# Patient Record
Sex: Female | Born: 1960
Health system: Southern US, Community
[De-identification: ages and names within clinical notes are randomized; demographics above are authoritative.]

## PROBLEM LIST (undated history)

## (undated) DIAGNOSIS — Z923 Personal history of irradiation: Secondary | ICD-10-CM

## (undated) DIAGNOSIS — K649 Unspecified hemorrhoids: Secondary | ICD-10-CM

## (undated) DIAGNOSIS — C801 Malignant (primary) neoplasm, unspecified: Secondary | ICD-10-CM

## (undated) DIAGNOSIS — Z9221 Personal history of antineoplastic chemotherapy: Secondary | ICD-10-CM

## (undated) DIAGNOSIS — C50911 Malignant neoplasm of unspecified site of right female breast: Secondary | ICD-10-CM

## (undated) HISTORY — DX: Malignant neoplasm of unspecified site of right female breast: C50.911

## (undated) HISTORY — DX: Malignant (primary) neoplasm, unspecified: C80.1

## (undated) HISTORY — DX: Unspecified hemorrhoids: K64.9

---

## 1998-01-03 ENCOUNTER — Other Ambulatory Visit: Admission: RE | Admit: 1998-01-03 | Discharge: 1998-01-03 | Payer: Self-pay | Admitting: Obstetrics and Gynecology

## 1998-05-19 ENCOUNTER — Ambulatory Visit (HOSPITAL_COMMUNITY): Admission: RE | Admit: 1998-05-19 | Discharge: 1998-05-19 | Payer: Self-pay | Admitting: Obstetrics and Gynecology

## 1999-01-12 ENCOUNTER — Other Ambulatory Visit: Admission: RE | Admit: 1999-01-12 | Discharge: 1999-01-12 | Payer: Self-pay | Admitting: Obstetrics and Gynecology

## 2003-02-22 ENCOUNTER — Encounter: Admission: RE | Admit: 2003-02-22 | Discharge: 2003-02-22 | Payer: Self-pay | Admitting: Family Medicine

## 2003-02-22 ENCOUNTER — Encounter: Payer: Self-pay | Admitting: Family Medicine

## 2004-01-12 ENCOUNTER — Ambulatory Visit (HOSPITAL_COMMUNITY): Admission: RE | Admit: 2004-01-12 | Discharge: 2004-01-12 | Payer: Self-pay | Admitting: Obstetrics & Gynecology

## 2008-08-05 HISTORY — PX: COLONOSCOPY: SHX174

## 2008-11-07 ENCOUNTER — Other Ambulatory Visit: Admission: RE | Admit: 2008-11-07 | Discharge: 2008-11-07 | Payer: Self-pay | Admitting: Obstetrics & Gynecology

## 2009-03-05 ENCOUNTER — Observation Stay (HOSPITAL_COMMUNITY): Admission: EM | Admit: 2009-03-05 | Discharge: 2009-03-07 | Payer: Self-pay | Admitting: Emergency Medicine

## 2009-03-07 ENCOUNTER — Ambulatory Visit: Payer: Self-pay | Admitting: Internal Medicine

## 2009-08-05 DIAGNOSIS — Z923 Personal history of irradiation: Secondary | ICD-10-CM

## 2009-08-05 DIAGNOSIS — Z9221 Personal history of antineoplastic chemotherapy: Secondary | ICD-10-CM

## 2009-08-05 HISTORY — DX: Personal history of antineoplastic chemotherapy: Z92.21

## 2009-08-05 HISTORY — DX: Personal history of irradiation: Z92.3

## 2009-08-05 HISTORY — PX: MASTECTOMY: SHX3

## 2009-11-16 ENCOUNTER — Encounter: Admission: RE | Admit: 2009-11-16 | Discharge: 2009-11-16 | Payer: Self-pay | Admitting: Obstetrics and Gynecology

## 2009-11-16 DIAGNOSIS — C50911 Malignant neoplasm of unspecified site of right female breast: Secondary | ICD-10-CM

## 2009-11-16 HISTORY — DX: Malignant neoplasm of unspecified site of right female breast: C50.911

## 2009-11-21 ENCOUNTER — Ambulatory Visit: Payer: Self-pay | Admitting: Oncology

## 2009-11-24 ENCOUNTER — Encounter: Admission: RE | Admit: 2009-11-24 | Discharge: 2009-11-24 | Payer: Self-pay | Admitting: Internal Medicine

## 2009-12-18 ENCOUNTER — Other Ambulatory Visit: Admission: RE | Admit: 2009-12-18 | Discharge: 2009-12-18 | Payer: Self-pay | Admitting: Obstetrics & Gynecology

## 2009-12-21 ENCOUNTER — Ambulatory Visit (HOSPITAL_COMMUNITY): Admission: RE | Admit: 2009-12-21 | Discharge: 2009-12-22 | Payer: Self-pay | Admitting: Surgery

## 2009-12-21 ENCOUNTER — Encounter (INDEPENDENT_AMBULATORY_CARE_PROVIDER_SITE_OTHER): Payer: Self-pay | Admitting: Surgery

## 2010-01-04 ENCOUNTER — Ambulatory Visit: Payer: Self-pay | Admitting: Oncology

## 2010-01-05 LAB — CBC WITH DIFFERENTIAL/PLATELET
BASO%: 0.3 % (ref 0.0–2.0)
HCT: 38.5 % (ref 34.8–46.6)
HGB: 13.2 g/dL (ref 11.6–15.9)
MCV: 100 fL (ref 79.5–101.0)
MONO#: 0.5 10*3/uL (ref 0.1–0.9)
NEUT%: 71.1 % (ref 38.4–76.8)
Platelets: 297 10*3/uL (ref 145–400)
RBC: 3.85 10*6/uL (ref 3.70–5.45)
RDW: 14.6 % — ABNORMAL HIGH (ref 11.2–14.5)
WBC: 7 10*3/uL (ref 3.9–10.3)

## 2010-01-05 LAB — COMPREHENSIVE METABOLIC PANEL
AST: 15 U/L (ref 0–37)
Alkaline Phosphatase: 45 U/L (ref 39–117)
CO2: 26 mEq/L (ref 19–32)
Chloride: 102 mEq/L (ref 96–112)
Creatinine, Ser: 0.58 mg/dL (ref 0.40–1.20)
Potassium: 4.2 mEq/L (ref 3.5–5.3)
Total Bilirubin: 0.2 mg/dL — ABNORMAL LOW (ref 0.3–1.2)

## 2010-01-11 ENCOUNTER — Ambulatory Visit: Admission: RE | Admit: 2010-01-11 | Discharge: 2010-03-12 | Payer: Self-pay | Admitting: Radiation Oncology

## 2010-01-17 ENCOUNTER — Ambulatory Visit: Payer: Self-pay | Admitting: Cardiology

## 2010-01-17 ENCOUNTER — Encounter: Payer: Self-pay | Admitting: Oncology

## 2010-01-17 ENCOUNTER — Ambulatory Visit: Admission: RE | Admit: 2010-01-17 | Discharge: 2010-01-17 | Payer: Self-pay | Admitting: Oncology

## 2010-02-07 ENCOUNTER — Ambulatory Visit: Payer: Self-pay | Admitting: Oncology

## 2010-02-09 LAB — COMPREHENSIVE METABOLIC PANEL
AST: 17 U/L (ref 0–37)
BUN: 8 mg/dL (ref 6–23)
Calcium: 9.4 mg/dL (ref 8.4–10.5)
Chloride: 105 mEq/L (ref 96–112)
Creatinine, Ser: 0.5 mg/dL (ref 0.40–1.20)
Sodium: 139 mEq/L (ref 135–145)

## 2010-02-09 LAB — CBC WITH DIFFERENTIAL/PLATELET
Basophils Absolute: 0 10*3/uL (ref 0.0–0.1)
EOS%: 0.9 % (ref 0.0–7.0)
HCT: 40.8 % (ref 34.8–46.6)
HGB: 13.8 g/dL (ref 11.6–15.9)
MCH: 33.2 pg (ref 25.1–34.0)
MCHC: 33.8 g/dL (ref 31.5–36.0)
MCV: 98.1 fL (ref 79.5–101.0)
MONO#: 0.6 10*3/uL (ref 0.1–0.9)
NEUT%: 65 % (ref 38.4–76.8)
Platelets: 194 10*3/uL (ref 145–400)
RDW: 12.8 % (ref 11.2–14.5)

## 2010-02-23 LAB — COMPREHENSIVE METABOLIC PANEL
Albumin: 4 g/dL (ref 3.5–5.2)
BUN: 9 mg/dL (ref 6–23)
Creatinine, Ser: 0.5 mg/dL (ref 0.40–1.20)
Glucose, Bld: 106 mg/dL — ABNORMAL HIGH (ref 70–99)
Sodium: 139 mEq/L (ref 135–145)
Total Bilirubin: 0.1 mg/dL — ABNORMAL LOW (ref 0.3–1.2)
Total Protein: 6.3 g/dL (ref 6.0–8.3)

## 2010-02-23 LAB — CBC WITH DIFFERENTIAL/PLATELET
BASO%: 1.1 % (ref 0.0–2.0)
Basophils Absolute: 0.1 10*3/uL (ref 0.0–0.1)
HCT: 39.3 % (ref 34.8–46.6)
HGB: 13.3 g/dL (ref 11.6–15.9)
MCH: 32.8 pg (ref 25.1–34.0)
MCV: 97 fL (ref 79.5–101.0)
Platelets: 192 10*3/uL (ref 145–400)
RDW: 12.6 % (ref 11.2–14.5)
WBC: 12.9 10*3/uL — ABNORMAL HIGH (ref 3.9–10.3)
nRBC: 0 % (ref 0–0)

## 2010-03-09 ENCOUNTER — Ambulatory Visit: Payer: Self-pay | Admitting: Oncology

## 2010-03-09 LAB — COMPREHENSIVE METABOLIC PANEL
AST: 20 U/L (ref 0–37)
Albumin: 4.3 g/dL (ref 3.5–5.2)
Alkaline Phosphatase: 77 U/L (ref 39–117)
CO2: 22 mEq/L (ref 19–32)
Calcium: 8.9 mg/dL (ref 8.4–10.5)
Potassium: 3.9 mEq/L (ref 3.5–5.3)
Total Protein: 6.4 g/dL (ref 6.0–8.3)

## 2010-03-09 LAB — CBC WITH DIFFERENTIAL/PLATELET
BASO%: 0.6 % (ref 0.0–2.0)
Eosinophils Absolute: 0 10*3/uL (ref 0.0–0.5)
MCHC: 33.5 g/dL (ref 31.5–36.0)
MONO#: 0.4 10*3/uL (ref 0.1–0.9)
MONO%: 9.9 % (ref 0.0–14.0)
lymph#: 1 10*3/uL (ref 0.9–3.3)

## 2010-03-23 LAB — CBC WITH DIFFERENTIAL/PLATELET
BASO%: 0.2 % (ref 0.0–2.0)
Basophils Absolute: 0 10*3/uL (ref 0.0–0.1)
MCH: 32.2 pg (ref 25.1–34.0)
MONO%: 12.8 % (ref 0.0–14.0)
NEUT#: 6.6 10*3/uL — ABNORMAL HIGH (ref 1.5–6.5)
Platelets: 194 10*3/uL (ref 145–400)

## 2010-03-23 LAB — COMPREHENSIVE METABOLIC PANEL
ALT: 11 U/L (ref 0–35)
Albumin: 3.9 g/dL (ref 3.5–5.2)
Alkaline Phosphatase: 66 U/L (ref 39–117)
BUN: 7 mg/dL (ref 6–23)
CO2: 26 mEq/L (ref 19–32)
Calcium: 8.2 mg/dL — ABNORMAL LOW (ref 8.4–10.5)
Glucose, Bld: 85 mg/dL (ref 70–99)
Potassium: 3.9 mEq/L (ref 3.5–5.3)
Sodium: 143 mEq/L (ref 135–145)
Total Bilirubin: 0.2 mg/dL — ABNORMAL LOW (ref 0.3–1.2)

## 2010-04-06 LAB — CBC WITH DIFFERENTIAL/PLATELET
BASO%: 0.3 % (ref 0.0–2.0)
Basophils Absolute: 0 10*3/uL (ref 0.0–0.1)
HGB: 11.1 g/dL — ABNORMAL LOW (ref 11.6–15.9)
MCHC: 33.3 g/dL (ref 31.5–36.0)
NEUT#: 9.5 10*3/uL — ABNORMAL HIGH (ref 1.5–6.5)
NEUT%: 81.8 % — ABNORMAL HIGH (ref 38.4–76.8)
Platelets: 182 10*3/uL (ref 145–400)
RBC: 3.44 10*6/uL — ABNORMAL LOW (ref 3.70–5.45)
WBC: 11.6 10*3/uL — ABNORMAL HIGH (ref 3.9–10.3)

## 2010-04-06 LAB — COMPREHENSIVE METABOLIC PANEL
ALT: 11 U/L (ref 0–35)
AST: 17 U/L (ref 0–37)
CO2: 29 mEq/L (ref 19–32)
Creatinine, Ser: 0.49 mg/dL (ref 0.40–1.20)
Sodium: 143 mEq/L (ref 135–145)
Total Protein: 5.9 g/dL — ABNORMAL LOW (ref 6.0–8.3)

## 2010-04-17 ENCOUNTER — Ambulatory Visit: Payer: Self-pay | Admitting: Oncology

## 2010-04-20 LAB — CBC WITH DIFFERENTIAL/PLATELET
Basophils Absolute: 0 10*3/uL (ref 0.0–0.1)
HGB: 11.3 g/dL — ABNORMAL LOW (ref 11.6–15.9)
MCH: 32.7 pg (ref 25.1–34.0)
MCHC: 33.1 g/dL (ref 31.5–36.0)
MCV: 98.6 fL (ref 79.5–101.0)
MONO#: 0.7 10*3/uL (ref 0.1–0.9)
MONO%: 4.7 % (ref 0.0–14.0)
NEUT#: 13.8 10*3/uL — ABNORMAL HIGH (ref 1.5–6.5)
NEUT%: 87.9 % — ABNORMAL HIGH (ref 38.4–76.8)
Platelets: 174 10*3/uL (ref 145–400)
RBC: 3.46 10*6/uL — ABNORMAL LOW (ref 3.70–5.45)
RDW: 19.9 % — ABNORMAL HIGH (ref 11.2–14.5)
WBC: 15.7 10*3/uL — ABNORMAL HIGH (ref 3.9–10.3)

## 2010-04-20 LAB — COMPREHENSIVE METABOLIC PANEL
ALT: 19 U/L (ref 0–35)
AST: 25 U/L (ref 0–37)
Albumin: 3.9 g/dL (ref 3.5–5.2)
Alkaline Phosphatase: 104 U/L (ref 39–117)
BUN: 8 mg/dL (ref 6–23)
CO2: 27 mEq/L (ref 19–32)
Calcium: 8.7 mg/dL (ref 8.4–10.5)
Chloride: 104 mEq/L (ref 96–112)
Creatinine, Ser: 0.49 mg/dL (ref 0.40–1.20)
Glucose, Bld: 89 mg/dL (ref 70–99)
Potassium: 3.6 mEq/L (ref 3.5–5.3)
Sodium: 145 mEq/L (ref 135–145)
Total Bilirubin: 0.2 mg/dL — ABNORMAL LOW (ref 0.3–1.2)
Total Protein: 5.9 g/dL — ABNORMAL LOW (ref 6.0–8.3)

## 2010-04-20 LAB — MAGNESIUM: Magnesium: 1.6 mg/dL (ref 1.5–2.5)

## 2010-05-04 LAB — CBC WITH DIFFERENTIAL/PLATELET
BASO%: 0.2 % (ref 0.0–2.0)
EOS%: 0 % (ref 0.0–7.0)
LYMPH%: 9.1 % — ABNORMAL LOW (ref 14.0–49.7)
MCH: 33.4 pg (ref 25.1–34.0)
MCHC: 32.8 g/dL (ref 31.5–36.0)
MONO#: 0.9 10*3/uL (ref 0.1–0.9)
MONO%: 7.3 % (ref 0.0–14.0)
NEUT#: 9.9 10*3/uL — ABNORMAL HIGH (ref 1.5–6.5)
NEUT%: 83.4 % — ABNORMAL HIGH (ref 38.4–76.8)
Platelets: 195 10*3/uL (ref 145–400)
RDW: 21.9 % — ABNORMAL HIGH (ref 11.2–14.5)

## 2010-05-07 LAB — COMPREHENSIVE METABOLIC PANEL
ALT: 15 U/L (ref 0–35)
AST: 16 U/L (ref 0–37)
Albumin: 3.6 g/dL (ref 3.5–5.2)
Alkaline Phosphatase: 77 U/L (ref 39–117)
CO2: 27 mEq/L (ref 19–32)
Calcium: 8.6 mg/dL (ref 8.4–10.5)
Chloride: 103 mEq/L (ref 96–112)
Glucose, Bld: 104 mg/dL — ABNORMAL HIGH (ref 70–99)
Total Protein: 5.5 g/dL — ABNORMAL LOW (ref 6.0–8.3)

## 2010-05-17 ENCOUNTER — Ambulatory Visit: Payer: Self-pay | Admitting: Oncology

## 2010-05-17 LAB — COMPREHENSIVE METABOLIC PANEL
ALT: 15 U/L (ref 0–35)
AST: 20 U/L (ref 0–37)
BUN: 9 mg/dL (ref 6–23)
Calcium: 8.2 mg/dL — ABNORMAL LOW (ref 8.4–10.5)
Chloride: 104 mEq/L (ref 96–112)
Creatinine, Ser: 0.49 mg/dL (ref 0.40–1.20)
Glucose, Bld: 111 mg/dL — ABNORMAL HIGH (ref 70–99)
Potassium: 3.9 mEq/L (ref 3.5–5.3)
Total Bilirubin: 0.3 mg/dL (ref 0.3–1.2)

## 2010-05-17 LAB — CBC WITH DIFFERENTIAL/PLATELET
EOS%: 0 % (ref 0.0–7.0)
HCT: 33.5 % — ABNORMAL LOW (ref 34.8–46.6)
HGB: 10.9 g/dL — ABNORMAL LOW (ref 11.6–15.9)
MCH: 34.2 pg — ABNORMAL HIGH (ref 25.1–34.0)
MCHC: 32.5 g/dL (ref 31.5–36.0)
MCV: 105 fL — ABNORMAL HIGH (ref 79.5–101.0)
MONO#: 1.2 10*3/uL — ABNORMAL HIGH (ref 0.1–0.9)
MONO%: 8.3 % (ref 0.0–14.0)
NEUT#: 11.9 10*3/uL — ABNORMAL HIGH (ref 1.5–6.5)
NEUT%: 83.8 % — ABNORMAL HIGH (ref 38.4–76.8)
Platelets: 164 10*3/uL (ref 145–400)
RBC: 3.19 10*6/uL — ABNORMAL LOW (ref 3.70–5.45)
WBC: 14.2 10*3/uL — ABNORMAL HIGH (ref 3.9–10.3)

## 2010-05-18 ENCOUNTER — Ambulatory Visit
Admission: RE | Admit: 2010-05-18 | Discharge: 2010-07-20 | Payer: Self-pay | Source: Home / Self Care | Attending: Radiation Oncology | Admitting: Radiation Oncology

## 2010-06-08 LAB — COMPREHENSIVE METABOLIC PANEL
AST: 14 U/L (ref 0–37)
Albumin: 3.8 g/dL (ref 3.5–5.2)
BUN: 9 mg/dL (ref 6–23)
Calcium: 8.8 mg/dL (ref 8.4–10.5)
Creatinine, Ser: 0.43 mg/dL (ref 0.40–1.20)
Potassium: 4.4 mEq/L (ref 3.5–5.3)
Total Bilirubin: 0.4 mg/dL (ref 0.3–1.2)

## 2010-06-08 LAB — CBC WITH DIFFERENTIAL/PLATELET
Eosinophils Absolute: 0 10*3/uL (ref 0.0–0.5)
HCT: 33 % — ABNORMAL LOW (ref 34.8–46.6)
MCH: 37.8 pg — ABNORMAL HIGH (ref 25.1–34.0)
MCHC: 33.4 g/dL (ref 31.5–36.0)
MCV: 113.2 fL — ABNORMAL HIGH (ref 79.5–101.0)
MONO#: 0.6 10*3/uL (ref 0.1–0.9)
MONO%: 9.9 % (ref 0.0–14.0)
NEUT#: 5.1 10*3/uL (ref 1.5–6.5)
RBC: 2.92 10*6/uL — ABNORMAL LOW (ref 3.70–5.45)
WBC: 6.1 10*3/uL (ref 3.9–10.3)

## 2010-06-13 ENCOUNTER — Encounter: Payer: Self-pay | Admitting: Radiation Oncology

## 2010-06-13 ENCOUNTER — Ambulatory Visit: Admission: RE | Admit: 2010-06-13 | Discharge: 2010-06-13 | Payer: Self-pay | Admitting: Radiation Oncology

## 2010-06-13 ENCOUNTER — Ambulatory Visit: Payer: Self-pay | Admitting: Cardiology

## 2010-06-20 ENCOUNTER — Ambulatory Visit: Payer: Self-pay | Admitting: Oncology

## 2010-06-22 LAB — COMPREHENSIVE METABOLIC PANEL
AST: 21 U/L (ref 0–37)
Alkaline Phosphatase: 59 U/L (ref 39–117)
BUN: 9 mg/dL (ref 6–23)
Calcium: 9.2 mg/dL (ref 8.4–10.5)
Chloride: 99 mEq/L (ref 96–112)
Glucose, Bld: 102 mg/dL — ABNORMAL HIGH (ref 70–99)
Potassium: 3.9 mEq/L (ref 3.5–5.3)
Total Bilirubin: 0.3 mg/dL (ref 0.3–1.2)
Total Protein: 6.4 g/dL (ref 6.0–8.3)

## 2010-06-22 LAB — CBC WITH DIFFERENTIAL/PLATELET
BASO%: 0.2 % (ref 0.0–2.0)
Basophils Absolute: 0 10*3/uL (ref 0.0–0.1)
HCT: 43.4 % (ref 34.8–46.6)
MCH: 36.1 pg — ABNORMAL HIGH (ref 25.1–34.0)
MCHC: 32.9 g/dL (ref 31.5–36.0)
MONO#: 0.5 10*3/uL (ref 0.1–0.9)
NEUT%: 78.7 % — ABNORMAL HIGH (ref 38.4–76.8)
Platelets: 154 10*3/uL (ref 145–400)
RBC: 3.96 10*6/uL (ref 3.70–5.45)
RDW: 14.5 % (ref 11.2–14.5)
WBC: 4.1 10*3/uL (ref 3.9–10.3)
lymph#: 0.4 10*3/uL — ABNORMAL LOW (ref 0.9–3.3)

## 2010-06-22 LAB — MAGNESIUM: Magnesium: 1.9 mg/dL (ref 1.5–2.5)

## 2010-07-27 ENCOUNTER — Ambulatory Visit: Payer: Self-pay | Admitting: Oncology

## 2010-08-01 LAB — BASIC METABOLIC PANEL
BUN: 11 mg/dL (ref 6–23)
Chloride: 103 mEq/L (ref 96–112)
Creatinine, Ser: 0.52 mg/dL (ref 0.40–1.20)
Glucose, Bld: 88 mg/dL (ref 70–99)
Sodium: 142 mEq/L (ref 135–145)

## 2010-08-05 HISTORY — PX: ENDOMETRIAL ABLATION: SHX621

## 2010-08-26 ENCOUNTER — Encounter: Payer: Self-pay | Admitting: Obstetrics & Gynecology

## 2010-09-06 ENCOUNTER — Ambulatory Visit: Payer: PRIVATE HEALTH INSURANCE | Attending: Radiation Oncology | Admitting: Radiation Oncology

## 2010-09-06 ENCOUNTER — Ambulatory Visit: Payer: Self-pay | Admitting: Radiation Oncology

## 2010-09-21 ENCOUNTER — Other Ambulatory Visit: Payer: Self-pay | Admitting: Oncology

## 2010-09-21 ENCOUNTER — Encounter (HOSPITAL_BASED_OUTPATIENT_CLINIC_OR_DEPARTMENT_OTHER): Payer: PRIVATE HEALTH INSURANCE | Admitting: Oncology

## 2010-09-21 DIAGNOSIS — C50419 Malignant neoplasm of upper-outer quadrant of unspecified female breast: Secondary | ICD-10-CM

## 2010-09-21 DIAGNOSIS — C50919 Malignant neoplasm of unspecified site of unspecified female breast: Secondary | ICD-10-CM

## 2010-09-21 DIAGNOSIS — Z5189 Encounter for other specified aftercare: Secondary | ICD-10-CM

## 2010-09-21 DIAGNOSIS — Z5111 Encounter for antineoplastic chemotherapy: Secondary | ICD-10-CM

## 2010-09-21 LAB — CBC WITH DIFFERENTIAL/PLATELET
BASO%: 0.3 % (ref 0.0–2.0)
EOS%: 1.1 % (ref 0.0–7.0)
Eosinophils Absolute: 0 10*3/uL (ref 0.0–0.5)
HGB: 14.4 g/dL (ref 11.6–15.9)
LYMPH%: 14.5 % (ref 14.0–49.7)
MCHC: 34.3 g/dL (ref 31.5–36.0)
MONO%: 9.1 % (ref 0.0–14.0)
NEUT#: 2.8 10*3/uL (ref 1.5–6.5)
RDW: 15.2 % — ABNORMAL HIGH (ref 11.2–14.5)
lymph#: 0.5 10*3/uL — ABNORMAL LOW (ref 0.9–3.3)

## 2010-09-21 LAB — COMPREHENSIVE METABOLIC PANEL
ALT: 15 U/L (ref 0–35)
Potassium: 4.1 mEq/L (ref 3.5–5.3)
Sodium: 145 mEq/L (ref 135–145)
Total Protein: 6.2 g/dL (ref 6.0–8.3)

## 2010-09-21 LAB — MAGNESIUM: Magnesium: 1.7 mg/dL (ref 1.5–2.5)

## 2010-10-02 ENCOUNTER — Other Ambulatory Visit: Payer: Self-pay | Admitting: Oncology

## 2010-10-02 DIAGNOSIS — Z9010 Acquired absence of unspecified breast and nipple: Secondary | ICD-10-CM

## 2010-10-02 DIAGNOSIS — Z853 Personal history of malignant neoplasm of breast: Secondary | ICD-10-CM

## 2010-10-22 ENCOUNTER — Ambulatory Visit (HOSPITAL_BASED_OUTPATIENT_CLINIC_OR_DEPARTMENT_OTHER)
Admission: RE | Admit: 2010-10-22 | Discharge: 2010-10-22 | Disposition: A | Discharge status: Home / Self Care | Payer: PRIVATE HEALTH INSURANCE | Source: Ambulatory Visit | Attending: Surgery | Admitting: Surgery

## 2010-10-22 DIAGNOSIS — C50919 Malignant neoplasm of unspecified site of unspecified female breast: Secondary | ICD-10-CM | POA: Insufficient documentation

## 2010-10-22 DIAGNOSIS — Z9010 Acquired absence of unspecified breast and nipple: Secondary | ICD-10-CM | POA: Insufficient documentation

## 2010-10-22 DIAGNOSIS — Z452 Encounter for adjustment and management of vascular access device: Secondary | ICD-10-CM | POA: Insufficient documentation

## 2010-10-22 LAB — COMPREHENSIVE METABOLIC PANEL
ALT: 14 U/L (ref 0–35)
AST: 21 U/L (ref 0–37)
Albumin: 3.9 g/dL (ref 3.5–5.2)
BUN: 10 mg/dL (ref 6–23)
CO2: 30 mEq/L (ref 19–32)
Calcium: 9.6 mg/dL (ref 8.4–10.5)
Creatinine, Ser: 0.62 mg/dL (ref 0.4–1.2)
Glucose, Bld: 82 mg/dL (ref 70–99)
Total Bilirubin: 0.6 mg/dL (ref 0.3–1.2)

## 2010-10-22 LAB — URINALYSIS, ROUTINE W REFLEX MICROSCOPIC
Hgb urine dipstick: NEGATIVE
Nitrite: NEGATIVE
Specific Gravity, Urine: 1.026 (ref 1.005–1.030)
Urobilinogen, UA: 1 mg/dL (ref 0.0–1.0)

## 2010-10-22 LAB — DIFFERENTIAL
Basophils Absolute: 0 10*3/uL (ref 0.0–0.1)
Basophils Relative: 0 % (ref 0–1)
Monocytes Relative: 8 % (ref 3–12)
Neutro Abs: 5.6 10*3/uL (ref 1.7–7.7)
Neutrophils Relative %: 68 % (ref 43–77)

## 2010-10-22 LAB — CBC
HCT: 43.8 % (ref 36.0–46.0)
Hemoglobin: 15.1 g/dL — ABNORMAL HIGH (ref 12.0–15.0)
MCHC: 34.5 g/dL (ref 30.0–36.0)
RBC: 4.4 MIL/uL (ref 3.87–5.11)

## 2010-10-25 NOTE — Op Note (Signed)
  NAMELIESE, DIZDAREVIC              ACCOUNT NO.:  [id number]  MEDICAL RECORD NO.:  [id number]           PATIENT TYPE:  LOCATION:                                 FACILITY:  PHYSICIAN:  Currie Paris, M.D.DATE OF BIRTH:  June 13, 1961  DATE OF PROCEDURE:  10/22/2010 DATE OF DISCHARGE:                              OPERATIVE REPORT   PREOPERATIVE DIAGNOSIS:  Unneeded Port-A-Cath.  POSTOPERATIVE DIAGNOSIS:  Unneeded Port-A-Cath.  PROCEDURE:  Removal of Port-A-Cath.  SURGEON:  Currie Paris, MD  ANESTHESIA:  Local.  CLINICAL HISTORY:  This is a 50 year old who has finished all of her chemo and wished to have her port removed.  DESCRIPTION OF PROCEDURE:  I saw the patient in the holding area and marked the area of the port and confirmed the plans with the patient.  The patient was taken to the operating room and the time-out was done. I injected 1% Xylocaine with epi around the port site.  I waited 10 minutes for good effect of the epinephrine and then prepped the area with Betadine.  The old scar was incised.  The capsule was entered.  The two holding sutures of Prolene were cut.  The port was backed part way out.  I used a 3-0 Vicryl to put a figure-of-eight around the port tract and backed the tubing out and tied the knot down so there was no air embolus or backbleeding.  The incision was then closed with 3-0 Vicryl, 4-0 Monocryl subcuticular, and Dermabond.  The patient tolerated the procedure well, and there were no complications.     Currie Paris, M.D.     CJS/MEDQ  D:  10/22/2010  T:  10/23/2010  Job:  [id number]  Electronically Signed by Cyndia Bent M.D. on 10/25/2010 07:42:10 AM

## 2010-10-29 ENCOUNTER — Ambulatory Visit
Admission: RE | Admit: 2010-10-29 | Discharge: 2010-10-29 | Disposition: A | Discharge status: Home / Self Care | Payer: PRIVATE HEALTH INSURANCE | Source: Ambulatory Visit | Attending: Oncology | Admitting: Oncology

## 2010-10-29 DIAGNOSIS — Z9010 Acquired absence of unspecified breast and nipple: Secondary | ICD-10-CM

## 2010-10-29 DIAGNOSIS — Z853 Personal history of malignant neoplasm of breast: Secondary | ICD-10-CM

## 2010-11-10 LAB — RETICULOCYTES
Retic Count, Absolute: 35.3 10*3/uL (ref 19.0–186.0)
Retic Ct Pct: 0.9 % (ref 0.4–3.1)

## 2010-11-10 LAB — DIFFERENTIAL
Basophils Absolute: 0 10*3/uL (ref 0.0–0.1)
Basophils Relative: 0 % (ref 0–1)
Basophils Relative: 1 % (ref 0–1)
Eosinophils Absolute: 0 10*3/uL (ref 0.0–0.7)
Eosinophils Absolute: 0.1 10*3/uL (ref 0.0–0.7)
Eosinophils Relative: 1 % (ref 0–5)
Monocytes Relative: 8 % (ref 3–12)
Neutro Abs: 3.2 10*3/uL (ref 1.7–7.7)
Neutrophils Relative %: 51 % (ref 43–77)
Neutrophils Relative %: 54 % (ref 43–77)

## 2010-11-10 LAB — IRON AND TIBC: Iron: 233 ug/dL — ABNORMAL HIGH (ref 42–135)

## 2010-11-10 LAB — BASIC METABOLIC PANEL
BUN: 10 mg/dL (ref 6–23)
CO2: 25 mEq/L (ref 19–32)
Chloride: 105 mEq/L (ref 96–112)
Creatinine, Ser: 0.51 mg/dL (ref 0.4–1.2)

## 2010-11-10 LAB — CBC
HCT: 37 % (ref 36.0–46.0)
Hemoglobin: 12.7 g/dL (ref 12.0–15.0)
MCHC: 34.1 g/dL (ref 30.0–36.0)
MCV: 101.6 fL — ABNORMAL HIGH (ref 78.0–100.0)
Platelets: 198 10*3/uL (ref 150–400)
RBC: 3.7 MIL/uL — ABNORMAL LOW (ref 3.87–5.11)
RDW: 13.3 % (ref 11.5–15.5)

## 2010-11-10 LAB — VITAMIN B12: Vitamin B-12: 221 pg/mL (ref 211–911)

## 2010-11-10 LAB — PROTIME-INR: INR: 0.9 (ref 0.00–1.49)

## 2010-11-10 LAB — COMPREHENSIVE METABOLIC PANEL
Alkaline Phosphatase: 42 U/L (ref 39–117)
BUN: 7 mg/dL (ref 6–23)
CO2: 25 mEq/L (ref 19–32)
Chloride: 111 mEq/L (ref 96–112)
GFR calc non Af Amer: >60 mL/min (ref >60)
Glucose, Bld: 89 mg/dL (ref 70–99)
Potassium: 3.7 mEq/L (ref 3.5–5.1)
Total Bilirubin: 0.7 mg/dL (ref 0.3–1.2)
Total Protein: 5.3 g/dL — ABNORMAL LOW (ref 6.0–8.3)

## 2010-11-10 LAB — URINALYSIS, ROUTINE W REFLEX MICROSCOPIC
Leukocytes, UA: NEGATIVE
Nitrite: NEGATIVE
Protein, ur: NEGATIVE mg/dL
Urobilinogen, UA: 0.2 mg/dL (ref 0.0–1.0)

## 2010-11-10 LAB — HEMOGLOBIN AND HEMATOCRIT, BLOOD: HCT: 38.8 % (ref 36.0–46.0)

## 2010-11-10 LAB — ABO/RH: ABO/RH(D): O POS

## 2010-11-10 LAB — URINE MICROSCOPIC-ADD ON

## 2010-11-10 LAB — FOLATE RBC: RBC Folate: 240 ng/mL (ref 180–600)

## 2010-11-21 ENCOUNTER — Ambulatory Visit: Payer: PRIVATE HEALTH INSURANCE | Attending: Surgery | Admitting: Physical Therapy

## 2010-11-21 DIAGNOSIS — Z853 Personal history of malignant neoplasm of breast: Secondary | ICD-10-CM | POA: Insufficient documentation

## 2010-11-21 DIAGNOSIS — IMO0001 Reserved for inherently not codable concepts without codable children: Secondary | ICD-10-CM | POA: Insufficient documentation

## 2010-11-21 DIAGNOSIS — I890 Lymphedema, not elsewhere classified: Secondary | ICD-10-CM | POA: Insufficient documentation

## 2010-12-05 ENCOUNTER — Ambulatory Visit: Payer: PRIVATE HEALTH INSURANCE | Attending: Surgery | Admitting: Physical Therapy

## 2010-12-05 DIAGNOSIS — I890 Lymphedema, not elsewhere classified: Secondary | ICD-10-CM | POA: Insufficient documentation

## 2010-12-05 DIAGNOSIS — IMO0001 Reserved for inherently not codable concepts without codable children: Secondary | ICD-10-CM | POA: Insufficient documentation

## 2010-12-05 DIAGNOSIS — Z853 Personal history of malignant neoplasm of breast: Secondary | ICD-10-CM | POA: Insufficient documentation

## 2010-12-10 ENCOUNTER — Ambulatory Visit: Payer: PRIVATE HEALTH INSURANCE | Admitting: Physical Therapy

## 2010-12-12 ENCOUNTER — Ambulatory Visit: Payer: PRIVATE HEALTH INSURANCE | Admitting: Physical Therapy

## 2010-12-17 ENCOUNTER — Encounter: Payer: PRIVATE HEALTH INSURANCE | Admitting: Physical Therapy

## 2010-12-18 NOTE — H&P (Signed)
NAME:  Debbie Woods, Debbie Woods NO.:  [id number]   MEDICAL RECORD NO.:  [id number]          PATIENT TYPE:  INP   LOCATION:  5030                         FACILITY:  [facility]   PHYSICIAN:  Massie Maroon, MD        DATE OF BIRTH:  Aug 26, 1960   DATE OF ADMISSION:  03/05/2009  DATE OF DISCHARGE:                              HISTORY & PHYSICAL   PRIMARY CARE PHYSICIAN:  Dr Christell Constant   CHIEF COMPLAINT:  Rectal bleeding.   HISTORY OF PRESENT ILLNESS:  This is a 50 year old female with the chief  complaint of rectal bleeding.  She apparently had painless rectal  bleeding starting about 9 p.m.  It was not associated with a BM.  She  apparently went to the bathroom to urinate and that is when she noticed  that she had rectal bleeding tonight.  She has had rectal bleeding for  the last 6 months.  It is very minimal and she described as spotting;  however, tonight it was worse in nature and it was in the toilet bowl as  well as being on toilet paper.  According to the ER notes, she was  apparently told by her physician that she has hemorrhoids.  There is no  history of any cancer.  She has not had a prior colonoscopy.  The  patient denies any fever, chills, nausea, vomiting, heartburn, diarrhea,  black stool, constipation.  Apparently the ER physician performed an  anoscopy or rectal exam and did not see anything.  The patient will be  admitted for rectal bleeding.   PAST MEDICAL HISTORY:  Dysmenorrhea and menometrorrhagia.   PAST SURGICAL HISTORY:  Tubal ligation, fifth toe amputation, some form  of saline injection which was done to stop her periods.   SOCIAL HISTORY:  The patient smokes one pack per day x15 years.  She  drinks about 2 glass of wine per day.  She lives at home.  She is  working as a Occupational hygienist.   FAMILY HISTORY:  Mother is alive at age 20 and healthy.  Father is alive  at age 73 and healthy.  The patient has 2 brothers who are healthy.  There is no family  history of colon cancer or colon polyps.  No history  of inflammatory bowel disease.   ALLERGIES:  No known drug allergies.   MEDICATIONS:  None.  Note, the patient has not been taking aspirins or  ibuprofens.   REVIEW OF SYSTEMS:  Negative for all 10 organ systems except for  pertinent positives stated above.   PHYSICAL EXAMINATION:  Temperature 98.2, pulse 66, blood pressure  132/84, respiratory rate 16, pulse ox 97% on room air.  HEENT: Anicteric, EOMI, no nystagmus;  pupils 1.5 mm, symmetric, direct,  consensual, near reflexes intact.  Mucous membranes moist.  NECK: No JVD, no bruit, no thyromegaly, no adenopathy.  HEART:  Regular rate and rhythm, S1, S2; no murmurs, gallops or rubs.  LUNGS: Clear to auscultation bilaterally.  ABDOMEN: Soft, nontender, nondistended, positive bowel sounds.  EXTREMITIES: No cyanosis, clubbing or edema.  SKIN: No rashes, no palmar  erythema, no asterixis.  LYMPH NODES:  No cervical or supraclavicular adenopathy.  NEUROLOGIC EXAM:  Nonfocal, cranial nerves II-XII intact; reflexes 2+,  symmetric, diffuse with downgoing toes bilaterally; motor strength 5/5  in all 4 extremities, pinprick intact.   LABORATORY DATA:  Sodium 140, potassium 3.5, chloride 105, bicarb 25,  BUN 10, creatinine 0.51. PT 12.3, INR 0.9.  WBC 7.5, hemoglobin 14.2,  platelet count 198.  Urinalysis shows rbc's 3-6, squamous epithelial  cells many.  Urine pregnancy test negative.   ASSESSMENT/PLAN:  1. Bright red blood per rectum:  This may be possibly due to      hemorrhoids.  The patient will be observed.  The patient will be      made n.p.o.  We will hydrate her gently with IV fluids.  She has      been typed and screened in the ED.  We will repeat a CBC  this      morning.  GI consult to evaluate for bright red blood per rectum.  2. Tobacco dependence:  Nicotine patch 21 mg topically daily if the      patient so desires.  3. Deep vein thrombosis prophylaxis:  SCDs and  TEDs.      Massie Maroon, MD  Electronically Signed     JYK/MEDQ  D:  03/06/2009  T:  03/06/2009  Job:  [id number]   cc:   Dr. Christell Constant  [facility]

## 2010-12-18 NOTE — Discharge Summary (Signed)
NAME:  Debbie Woods, Debbie Woods NO.:  [id number]   MEDICAL RECORD NO.:  [id number]          PATIENT TYPE:  OBV   LOCATION:  5030                         FACILITY:  [facility]   PHYSICIAN:  Monte Fantasia, MD  DATE OF BIRTH:  06-Feb-1961   DATE OF ADMISSION:  03/05/2009  DATE OF DISCHARGE:  03/07/2009                               DISCHARGE SUMMARY   PRIMARY CARE PHYSICIAN:  Ernestina Penna, MD, at [facility].   DISCHARGE DIAGNOSES:  1. Gastrointestinal bleed.  2. Alcohol abuse.  3. Macrocytosis.  4. Tobacco dependence.   MEDICATIONS UPON DISCHARGE:  1. Folic acid 1 mg p.o. daily.  2. Nicotine patch 21 mg to the chest wall for 7 days and then 14 mg to      the chest wall p.o. daily for 7 days, then discontinue.  3. Thiamine 100 mg p.o. daily.  4. Anusol-HC Suppositories nightly.   PROCEDURES DONE DURING THE STAY IN THE HOSPITAL:  Colonoscopy done by  Dr. Juanda Chance on March 07, 2009.  Results, first grade internal  hemorrhoids.  No stigmata of bleeding.  Impression:  Probable  hemorrhoidal bleed.   COURSE DURING THE HOSPITAL STAY:  [name] is a pleasant 50 year old  Caucasian lady.  The patient was admitted on March 05, 2009, with  complaints of passing bright red blood in her stools.  The patient  states that she had 2 episodes of it.  Denied any complaints of  dizziness, chest pain, or shortness of breath.  The patient was admitted  to the telemetry bed, and H and H was monitored q.12 h.  The patient has  maintained her hemoglobin levels through the stay in the hospital.  The  patient was also evaluated by [facility], Dr. Juanda Chance, and  underwent a colonoscopy today.  As per the colonoscopy results, showed a  grade 1 internal hemorrhoids with no evidence of any active bleeding  from any site.  The patient is recommended to have a high-fiber diet,  reduce her alcohol intake, and have Anusol Suppositories nightly for 15  days.  At  present, the patient is medically stable to be discharged and  can be discharged home.   The patient was found to have macrocytosis with her MCV of 100.2.  The  patient does admit to have regular glass of wine and rum and coke over  the weekend.  The patient is recommended to stop alcohol for the same.  The patient has been told to quit alcohol drinking and has been  counseled regarding the same.   LABORATORIES DONE DURING THE STAY IN THE HOSPITAL:  WBC 6.0, hemoglobin  13.2, hematocrit 38.8, platelets of 186, MCV of 100.2, and retic count  of 0.9%.  Sodium 139, potassium 3.7, chloride 111, bicarb 25, glucose  89, BUN 7, and creatinine 0.48.  Total bilirubin 0.7, alkaline  phosphatase 42, AST 19, ALT 14, total protein 5.3, albumin 3.1, and  calcium 7.9.   Anemia studies:  Serum iron levels 233, TIBC not calculated, percentage  saturation not calculated, vitamin B12 of 221, folate 9.1, and ferritin  111.  UA is negative.   DISPOSITION:  The patient is medically stable to be discharged and can  be discharged home.  The patient is recommended to follow up with her  primary care physician, Dr. Christell Constant in next 1-2 weeks.   TOTAL TIME FOR DISCHARGE:  40 minutes.      Monte Fantasia, MD  Electronically Signed     MP/MEDQ  D:  03/07/2009  T:  03/08/2009  Job:  [id number]   cc:   Ernestina Penna, M.D.

## 2010-12-18 NOTE — Consult Note (Signed)
NAME:  [name], [name] NO.:  [id number]   MEDICAL RECORD NO.:  [id number]          PATIENT TYPE:  OBV   LOCATION:  5030                         FACILITY:  [facility]   PHYSICIAN:  Antonietta Breach, M.D.  DATE OF BIRTH:  20-Nov-1960   DATE OF CONSULTATION:  03/07/2009  DATE OF DISCHARGE:  03/07/2009                                 CONSULTATION   REQUESTING PHYSICIAN:  [facility].   REASON FOR CONSULTATION:  Rule out alcohol pathology, assess and  recommend treatment options.   HISTORY OF PRESENT ILLNESS:  [name] is a 50 year old female  admitted to the [facility] on March 05, 2009 with rectal bleeding.   She discussed that she drinks wine daily with the admitting physician  and she does have an elevated mean corpuscular volume on her CBC.   The patient states to the undersigned that she has wine daily.  She  states that it is not more than a couple of glasses.  She denies any  occupational or social difficulties secondary to alcohol.  She has her  significant other in the room and requests that he be present to discuss  the assessment.   He does not know of any problems socially or occupationally with her  alcohol consumption.  She is providing no history of withdrawal.  She  discusses open-endedly her diet and requests additional information from  the undersigned about alcohol-related dietary problems.  She describes a  healthy diet.   She has constructive future goals and interests.  She does not appear to  have any excessive feeling on edge or worry.  She has no hallucinations  or delusions.  Her orientation and memory function are intact.  She is  socially appropriate and cooperative.   PAST PSYCHIATRIC HISTORY:  She denies any history of alcohol-related  medical, social or occupational difficulties.   FAMILY PSYCHIATRIC HISTORY:  None known.   SOCIAL HISTORY:  Divorced.  She works a regular occupation for [employer].  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  She does not use  any illegal drugs.  Please see the above discussion.  She is divorced.  Religion:  [employer].   PAST MEDICAL HISTORY:  Dysmenorrhea and menometrorrhagia.  She has a  history of tubal ligation, 5th toe amputation.   MEDICATIONS:  Her MAR is reviewed.   ALLERGIES:  She has no known drug allergies.   She is not on an alcohol withdrawal protocol.   She is not showing any tachycardia, fever, sweats or elevated blood  pressure indicative of withdrawal.   LABORATORY DATA:  She does not have elevated reticulocyte nor does she  have an elevated reticulocyte percentage.  B12 is low-normal.  Her folic  acid is normal.  Today's hemoglobin and hematocrit are normal.   SGOT 19, SGPT 14.  WBC 6.0, platelet count 186.  As mentioned above, she  does have an elevated MCV of 100.2.  Her RDW is normal.   Her INR is normal.  Urine pregnancy test negative.   REVIEW OF SYSTEMS:  Constitutional, head, eyes, ears, nose, throat,  mouth, neurologic, psychiatric, cardiovascular, respiratory,  gastrointestinal, genitourinary, skin, musculoskeletal, hematologic,  lymphatic, endocrine, metabolic all unremarkable.   PHYSICAL EXAMINATION:  VITAL SIGNS:  Temperature 97.1, pulse 50,  respiratory rate 18, blood pressure 126/75, O2 saturation on room air  99%.  GENERAL:  She is not showing any tremor or sweats.  OTHER GENERAL APPEARANCE:  [name] is a middle-aged female sitting up  in her hospital chair with no abnormal involuntary movements.   MENTAL STATUS EXAM:  [name] is alert.  Her eye contact is good.  Her  affect is mildly flat at baseline but with a broad and appropriate  range.  Her attention span is normal.  Her mood is within normal limits.  Her concentration is normal.  She is oriented completely to all spheres.  Her memory is intact to immediate, recent and remote.  Her fund of  knowledge and intelligence are within normal limits.   Her speech  involves normal rate and prosody without dysarthria.  Thought process is  logical, coherent, goal-directed.  No looseness of associations.  Thought content:  No thoughts of harming herself or others.  No  delusions, no hallucinations.  Insight is intact.  Judgment is intact.   ASSESSMENT:  AXIS I:  No diagnosis.  AXIS II:  Deferred.  AXIS III:  See past medical history.  She does have an elevated mean  corpuscular volume.  This may indicate that she does have some overall  deficient B12 absorption given that her B12 level was low normal.  However, she has not given any other signs of B12 deficiency such as  dorsal column symptoms, etc.  She could be in the stage of macrocytosis  before anemia sets in.  AXIS IV:  None identified.  AXIS V:  60.   [name] is not presenting any criteria for an alcohol problem from a  social and occupational perspective.  She has not demonstrated any  physiologic withdrawal signs.   Also she is not concerned that she has an alcohol problem.   RECOMMENDATIONS:  [name] agreed to contact psychiatry or her primary  care physician if she is concerned about her alcohol intake.  She  certainly is advised to utilize temperance and good judgment with her  use.   The undersigned provided extensive discussion of alcohol-related  symptoms that result with alcohol-related dietary deficiencies including  those involving thiamine, folic acid, etc.  She was advised to maintain  a stable, well-rounded diet.   Regarding her elevated MCV, would recommend any further monitoring if  indicated from an internal medicine perspective.      Antonietta Breach, M.D.  Electronically Signed     JW/MEDQ  D:  03/08/2009  T:  03/08/2009  Job:  [id number]

## 2010-12-19 ENCOUNTER — Encounter: Payer: PRIVATE HEALTH INSURANCE | Admitting: Physical Therapy

## 2010-12-21 ENCOUNTER — Encounter (INDEPENDENT_AMBULATORY_CARE_PROVIDER_SITE_OTHER): Payer: Self-pay | Admitting: Surgery

## 2010-12-21 NOTE — Op Note (Signed)
NAME:  Debbie Woods, Debbie Woods                        ACCOUNT NO.:  [id number]   MEDICAL RECORD NO.:  [id number]                   PATIENT TYPE:  AMB   LOCATION:  DAY                                  FACILITY:  [facility]   PHYSICIAN:  Lazaro Arms, M.D.                DATE OF BIRTH:  1961/05/19   DATE OF PROCEDURE:  01/12/2004  DATE OF DISCHARGE:                                 OPERATIVE REPORT   PREOPERATIVE DIAGNOSES:  1. Menometrorrhagia.  2. Dysmenorrhea.   POSTOPERATIVE DIAGNOSES:  1. Menometrorrhagia.  2. Dysmenorrhea.   PROCEDURE:  1. Hysteroscopy.  2. Dilatation and curettage.  3. Endometrial ablation.   SURGEON:  Lazaro Arms, M.D.   ANESTHESIA:  Endotracheal.   FINDINGS:  The patient had normal endometrium no abnormalities.   DESCRIPTION OF OPERATION:  The patient was taken to the operating room and  placed in the supine position where she underwent general endotracheal  anesthesia; placed in the low lithotomy position and prepped and draped in  the usual sterile fashion.  Speculum was placed.  Her cervix was grasped.  A  paracervical block using 1/2% Marcaine plain.   The cervix was dilated serially to allow passage of the hysteroscope.  Endometrial cavity was completely normal, no myomas, no polyps.  Uterine  curettage was performed.  Good uterine cry in all areas.  Endometrial  ablation was performed using a Gynecare ThermaChoice 3 balloon.  It only  required 11 cc of D5W to maintain a pressure of about 190 mmHg throughout  the case.  We heated the fluid to 88 degrees  Celsius for a total therapy  time of 8 minutes 45 seconds.  The patient tolerated the procedure well.  She experienced minimal blood loss and was taken to the recovery room in  good stable condition.  All counts were correct x3 and all fluid was  recovered at the end of the procedure.     ___________________________________________                                            Lazaro Arms, M.D.   LHE/MEDQ  D:  01/12/2004  T:  01/13/2004  Job:  [id number]

## 2011-01-08 ENCOUNTER — Encounter (INDEPENDENT_AMBULATORY_CARE_PROVIDER_SITE_OTHER): Payer: Self-pay | Admitting: Surgery

## 2011-01-11 ENCOUNTER — Other Ambulatory Visit: Payer: Self-pay | Admitting: Oncology

## 2011-01-11 ENCOUNTER — Encounter (HOSPITAL_BASED_OUTPATIENT_CLINIC_OR_DEPARTMENT_OTHER): Payer: PRIVATE HEALTH INSURANCE | Admitting: Oncology

## 2011-01-11 DIAGNOSIS — C773 Secondary and unspecified malignant neoplasm of axilla and upper limb lymph nodes: Secondary | ICD-10-CM

## 2011-01-11 DIAGNOSIS — C50419 Malignant neoplasm of upper-outer quadrant of unspecified female breast: Secondary | ICD-10-CM

## 2011-01-11 DIAGNOSIS — Z171 Estrogen receptor negative status [ER-]: Secondary | ICD-10-CM

## 2011-01-11 LAB — CBC WITH DIFFERENTIAL/PLATELET
BASO%: 0.3 % (ref 0.0–2.0)
EOS%: 1.4 % (ref 0.0–7.0)
LYMPH%: 12.1 % — ABNORMAL LOW (ref 14.0–49.7)
MCH: 33 pg (ref 25.1–34.0)
MCHC: 34 g/dL (ref 31.5–36.0)
MONO#: 0.5 10*3/uL (ref 0.1–0.9)
MONO%: 7.5 % (ref 0.0–14.0)
NEUT%: 78.7 % — ABNORMAL HIGH (ref 38.4–76.8)
RBC: 4.27 10*6/uL (ref 3.70–5.45)
RDW: 12.8 % (ref 11.2–14.5)
WBC: 6.6 10*3/uL (ref 3.9–10.3)
lymph#: 0.8 10*3/uL — ABNORMAL LOW (ref 0.9–3.3)

## 2011-01-11 LAB — COMPREHENSIVE METABOLIC PANEL
Albumin: 3.9 g/dL (ref 3.5–5.2)
BUN: 6 mg/dL (ref 6–23)
CO2: 28 mEq/L (ref 19–32)
Glucose, Bld: 81 mg/dL (ref 70–99)
Sodium: 144 mEq/L (ref 135–145)
Total Bilirubin: 0.3 mg/dL (ref 0.3–1.2)
Total Protein: 6.1 g/dL (ref 6.0–8.3)

## 2011-03-07 ENCOUNTER — Ambulatory Visit
Admission: RE | Admit: 2011-03-07 | Discharge: 2011-03-07 | Disposition: A | Discharge status: Home / Self Care | Payer: PRIVATE HEALTH INSURANCE | Source: Ambulatory Visit | Attending: Radiation Oncology | Admitting: Radiation Oncology

## 2011-04-16 ENCOUNTER — Other Ambulatory Visit (HOSPITAL_COMMUNITY)
Admission: RE | Admit: 2011-04-16 | Discharge: 2011-04-16 | Disposition: A | Discharge status: Home / Self Care | Payer: PRIVATE HEALTH INSURANCE | Source: Ambulatory Visit | Attending: Obstetrics & Gynecology | Admitting: Obstetrics & Gynecology

## 2011-04-16 ENCOUNTER — Other Ambulatory Visit: Payer: Self-pay | Admitting: Obstetrics & Gynecology

## 2011-04-16 DIAGNOSIS — Z01419 Encounter for gynecological examination (general) (routine) without abnormal findings: Secondary | ICD-10-CM | POA: Insufficient documentation

## 2011-05-24 ENCOUNTER — Ambulatory Visit (HOSPITAL_BASED_OUTPATIENT_CLINIC_OR_DEPARTMENT_OTHER): Payer: PRIVATE HEALTH INSURANCE | Admitting: Oncology

## 2011-05-24 ENCOUNTER — Other Ambulatory Visit: Payer: Self-pay | Admitting: Oncology

## 2011-05-24 DIAGNOSIS — Z853 Personal history of malignant neoplasm of breast: Secondary | ICD-10-CM

## 2011-05-24 DIAGNOSIS — C773 Secondary and unspecified malignant neoplasm of axilla and upper limb lymph nodes: Secondary | ICD-10-CM

## 2011-05-24 DIAGNOSIS — C50419 Malignant neoplasm of upper-outer quadrant of unspecified female breast: Secondary | ICD-10-CM

## 2011-05-24 DIAGNOSIS — C50919 Malignant neoplasm of unspecified site of unspecified female breast: Secondary | ICD-10-CM

## 2011-05-24 DIAGNOSIS — Z171 Estrogen receptor negative status [ER-]: Secondary | ICD-10-CM

## 2011-05-24 LAB — CBC WITH DIFFERENTIAL/PLATELET
Eosinophils Absolute: 0 10*3/uL (ref 0.0–0.5)
MONO#: 0.4 10*3/uL (ref 0.1–0.9)
NEUT#: 3.4 10*3/uL (ref 1.5–6.5)
Platelets: 206 10*3/uL (ref 145–400)
RBC: 4.29 10*6/uL (ref 3.70–5.45)
RDW: 13.7 % (ref 11.2–14.5)
WBC: 4.6 10*3/uL (ref 3.9–10.3)

## 2011-05-24 LAB — COMPREHENSIVE METABOLIC PANEL
Albumin: 4.4 g/dL (ref 3.5–5.2)
CO2: 22 mEq/L (ref 19–32)
Glucose, Bld: 122 mg/dL — ABNORMAL HIGH (ref 70–99)
Potassium: 4 mEq/L (ref 3.5–5.3)
Sodium: 142 mEq/L (ref 135–145)
Total Protein: 6.7 g/dL (ref 6.0–8.3)

## 2011-06-22 ENCOUNTER — Telehealth: Payer: Self-pay | Admitting: Oncology

## 2011-06-22 NOTE — Telephone Encounter (Signed)
test

## 2011-06-22 NOTE — Telephone Encounter (Signed)
Mailed the pt his her feb 2013 appts

## 2011-08-27 ENCOUNTER — Encounter: Payer: Self-pay | Admitting: *Deleted

## 2011-09-09 ENCOUNTER — Telehealth: Payer: Self-pay | Admitting: Oncology

## 2011-09-09 NOTE — Telephone Encounter (Signed)
pt called and r/s appt on 2-08 to 2-20

## 2011-09-13 ENCOUNTER — Ambulatory Visit: Payer: PRIVATE HEALTH INSURANCE | Admitting: Oncology

## 2011-09-13 ENCOUNTER — Other Ambulatory Visit: Payer: PRIVATE HEALTH INSURANCE | Admitting: Lab

## 2011-09-16 ENCOUNTER — Telehealth: Payer: Self-pay | Admitting: *Deleted

## 2011-09-16 NOTE — Telephone Encounter (Signed)
Opened chart in error.

## 2011-09-25 ENCOUNTER — Ambulatory Visit (HOSPITAL_BASED_OUTPATIENT_CLINIC_OR_DEPARTMENT_OTHER): Payer: PRIVATE HEALTH INSURANCE | Admitting: Oncology

## 2011-09-25 ENCOUNTER — Other Ambulatory Visit: Payer: PRIVATE HEALTH INSURANCE

## 2011-09-25 VITALS — BP 146/89 | HR 79 | Temp 97.5°F | Ht 61.0 in | Wt 144.3 lb

## 2011-09-25 DIAGNOSIS — Z853 Personal history of malignant neoplasm of breast: Secondary | ICD-10-CM

## 2011-09-25 LAB — COMPREHENSIVE METABOLIC PANEL
Albumin: 4.1 g/dL (ref 3.5–5.2)
Alkaline Phosphatase: 69 U/L (ref 39–117)
CO2: 28 mEq/L (ref 19–32)
Chloride: 105 mEq/L (ref 96–112)
Glucose, Bld: 74 mg/dL (ref 70–99)
Potassium: 3.8 mEq/L (ref 3.5–5.3)
Sodium: 145 mEq/L (ref 135–145)
Total Protein: 6.1 g/dL (ref 6.0–8.3)

## 2011-09-25 LAB — CBC WITH DIFFERENTIAL/PLATELET
Eosinophils Absolute: 0 10*3/uL (ref 0.0–0.5)
MONO#: 0.3 10*3/uL (ref 0.1–0.9)
MONO%: 4.3 % (ref 0.0–14.0)
NEUT#: 6 10*3/uL (ref 1.5–6.5)
RBC: 4.26 10*6/uL (ref 3.70–5.45)
RDW: 13.6 % (ref 11.2–14.5)
WBC: 7.3 10*3/uL (ref 3.9–10.3)

## 2011-09-25 NOTE — Progress Notes (Signed)
[facility] OFFICE PROGRESS NOTE   DIAGNOSIS:  History of a 2.8 cm grade 2 invasive ductal carcinoma, negative margins with 3 out of 20 lymph node positive, ER negative, PR negative, HER2/neu negative with Ki-67 ratio of 95%, status post right breast mastectomy and right axillary lymph node dissection.  PAST THERAPY:  adjuvant chemotherapy A/C  T between 02/09/2010 and 05/17/2010.  She also received adjuvant radiation.  CURRENT THERAPY:  watchful observation.  INTERVAL HISTORY: Debbie Woods 51 y.o. female returns for regular follow up.  She has been having some nasal congestion with sinus pain.  She has some nasal drainage but not purulent.  She denies fever, headache.  She has been to the beach and has not been on a diet.  Her weight continues to increase.  She has some swelling of the lower eyelids but no pain or visual changes.  She denies any breast abnormality.  Patient denies fatigue, headache, visual changes, confusion, drenching night sweats, palpable lymph node swelling, mucositis, odynophagia, dysphagia, nausea vomiting, jaundice, chest pain, palpitation, shortness of breath, dyspnea on exertion, productive cough, gum bleeding, epistaxis, hematemesis, hemoptysis, abdominal pain, abdominal swelling, early satiety, melena, hematochezia, hematuria, skin rash, spontaneous bleeding, joint swelling, joint pain, heat or cold intolerance, bowel bladder incontinence, back pain, focal motor weakness, paresthesia, depression, suicidal or homocidal ideation, feeling hopelessness.   Past Medical History  Diagnosis Date  . Cancer     Past Surgical History  Procedure Date  . Mastectomy 2011    Current Outpatient Prescriptions  Medication Sig Dispense Refill  . sodium-potassium bicarbonate (ALKA-SELTZER GOLD) TBEF Take 1 tablet by mouth daily as needed.      . meloxicam (MOBIC) 15 MG tablet         ALLERGIES:  is allergic to latex.  REVIEW OF SYSTEMS:  The rest of the  14-point review of system was negative.   Filed Vitals:   09/25/11 0912  BP: 146/89  Pulse: 79  Temp: 97.5 F (36.4 C)   Wt Readings from Last 3 Encounters:  09/25/11 144 lb 4.8 oz (65.454 kg)  05/24/11 137 lb 8 oz (62.37 kg)   ECOG Performance status: 0  PHYSICAL EXAMINATION:   General:  well-nourished in no acute distress.  Eyes:  no scleral icterus.  ENT:  There were no oropharyngeal lesions.  There were bilateral eye lids swelling.  Neck was without thyromegaly.  Lymphatics:  Negative cervical, supraclavicular or axillary adenopathy.  Respiratory: lungs were clear bilaterally without wheezing or crackles.  Cardiovascular:  Regular rate and rhythm, S1/S2, without murmur, rub or gallop.  There was no pedal edema.  GI:  abdomen was soft, flat, nontender, nondistended, without organomegaly.  Muscoloskeletal:  no spinal tenderness of palpation of vertebral spine.  Skin exam was without echymosis, petichae.  Neuro exam was nonfocal.  Patient was able to get on and off exam table without assistance.  Gait was normal.  Patient was alerted and oriented.  Attention was good.   Language was appropriate.  Mood was normal without depression.  Speech was not pressured.  Thought content was not tangential.   She declined breast exam as she was in a hurry. Marland Kitchen       LABORATORY/RADIOLOGY DATA:  Lab Results  Component Value Date   WBC 7.3 09/25/2011   HGB 14.6 09/25/2011   HCT 43.0 09/25/2011   PLT 201 09/25/2011   GLUCOSE 74 09/25/2011   ALT 16 09/25/2011   AST 33 09/25/2011   NA 145  09/25/2011   K 3.8 09/25/2011   CL 105 09/25/2011   CREATININE 0.54 09/25/2011   BUN 9 09/25/2011   CO2 28 09/25/2011   INR 0.9 03/05/2009    ASSESSMENT AND PLAN:   1. History of triple negative breast cancer.  I discussed with [name] that today on clinical history, and laboratory tests, there was no evidence of recurrence or metastatic disease.  She had a negative mammogram in March 2012.  I requested a surveillance  mammogram for March 2013.  2. Primary care.  She had colonoscopy with Dr. Juanda Chance and was negative in the past.  The next one is due in 2014.  Her last Pap smear was negative in August 2012.  She does not meet criteria for bone density scan.  3. Nasal congestion:  Low clinical concern for chronic sinusitis.  This is probably sinus allergy.  I advised her on OTC measures such as Sudafed or nasal irrigation.  No indication for empiric antibiotics.  If problem persists or worsens, then I may consider a course of empiric antibiotic.  4. Follow up with me in about 5 months.

## 2011-09-27 ENCOUNTER — Other Ambulatory Visit: Payer: Self-pay | Admitting: Oncology

## 2011-09-27 DIAGNOSIS — Z853 Personal history of malignant neoplasm of breast: Secondary | ICD-10-CM

## 2011-09-27 DIAGNOSIS — Z1231 Encounter for screening mammogram for malignant neoplasm of breast: Secondary | ICD-10-CM

## 2011-09-30 ENCOUNTER — Telehealth: Payer: Self-pay | Admitting: Oncology

## 2011-09-30 NOTE — Telephone Encounter (Signed)
called pt lmovm for mammogram appt for 03/26 @ [facility] and f/u in july2013.  asked pt to rtn call to confirm appt

## 2011-10-31 ENCOUNTER — Ambulatory Visit: Payer: PRIVATE HEALTH INSURANCE

## 2011-10-31 ENCOUNTER — Ambulatory Visit
Admission: RE | Admit: 2011-10-31 | Discharge: 2011-10-31 | Disposition: A | Discharge status: Home / Self Care | Payer: PRIVATE HEALTH INSURANCE | Source: Ambulatory Visit | Attending: Oncology | Admitting: Oncology

## 2011-10-31 DIAGNOSIS — Z853 Personal history of malignant neoplasm of breast: Secondary | ICD-10-CM

## 2011-10-31 DIAGNOSIS — Z1231 Encounter for screening mammogram for malignant neoplasm of breast: Secondary | ICD-10-CM

## 2012-01-10 ENCOUNTER — Encounter (INDEPENDENT_AMBULATORY_CARE_PROVIDER_SITE_OTHER): Payer: Self-pay | Admitting: General Surgery

## 2012-01-10 ENCOUNTER — Ambulatory Visit (INDEPENDENT_AMBULATORY_CARE_PROVIDER_SITE_OTHER): Payer: BC Managed Care – PPO | Admitting: Surgery

## 2012-01-10 ENCOUNTER — Encounter (INDEPENDENT_AMBULATORY_CARE_PROVIDER_SITE_OTHER): Payer: Self-pay | Admitting: Surgery

## 2012-01-10 VITALS — BP 116/82 | HR 70 | Temp 98.0°F | Resp 14 | Ht 61.5 in | Wt 133.8 lb

## 2012-01-10 DIAGNOSIS — C50919 Malignant neoplasm of unspecified site of unspecified female breast: Secondary | ICD-10-CM

## 2012-01-10 DIAGNOSIS — Z853 Personal history of malignant neoplasm of breast: Secondary | ICD-10-CM

## 2012-01-10 DIAGNOSIS — C50911 Malignant neoplasm of unspecified site of right female breast: Secondary | ICD-10-CM

## 2012-01-10 NOTE — Progress Notes (Signed)
NAME: [name]       DOB: 1961/04/26           DATE: 01/10/2012       MRN: [id number]   Debbie Woods is a 51 y.o.Marland Kitchenfemale who presents for routine followup of her IDC, Left, Stage II diagnosed in 2011 and treated with MRM followed by chemo and radiation. She has no problems or concerns on either side.Her lymphedema has not been a problem  PFSH: She has had no significant changes since the last visit here.  ROS: There have been no significant changes since the last visit here  EXAM:  VS: BP 116/82  Pulse 70  Temp(Src) 98 F (36.7 C) (Temporal)  Resp 14  Ht 5' 1.5" (1.562 m)  Wt 133 lb 12.8 oz (60.691 kg)  BMI 24.87 kg/m2   General: The patient is alert, oriented, generally healty appearing, NAD. Mood and affect are normal.  Breasts:  Right s/p mrm with no evidence of recurrence. Left is normal  Lymphatics: She has no axillary or supraclavicular adenopathy on either side.  Extremities: Full ROM of the surgical side with no lymphedema noted.The previously noted RUE lymphedema not apparent today  Data Reviewed: Mammogram in Feb [place]  Impression: Doing well, with no evidence of recurrent cancer or new cancer  Plan: She wants to F/U mainly with Dr Gaylyn Rong, so will see again PRN

## 2012-01-10 NOTE — Patient Instructions (Signed)
We will see you again on an as needed basis. Please call the office at [phone] if you have any questions or concerns. Thank you for allowing us to take care of you.  

## 2012-02-24 ENCOUNTER — Other Ambulatory Visit: Payer: PRIVATE HEALTH INSURANCE | Admitting: Lab

## 2012-02-24 ENCOUNTER — Ambulatory Visit: Payer: PRIVATE HEALTH INSURANCE | Admitting: Oncology

## 2012-02-26 ENCOUNTER — Other Ambulatory Visit (HOSPITAL_BASED_OUTPATIENT_CLINIC_OR_DEPARTMENT_OTHER): Payer: BC Managed Care – PPO | Admitting: Lab

## 2012-02-26 ENCOUNTER — Ambulatory Visit (HOSPITAL_BASED_OUTPATIENT_CLINIC_OR_DEPARTMENT_OTHER): Payer: Self-pay | Admitting: Oncology

## 2012-02-26 VITALS — BP 115/71 | HR 68 | Temp 98.6°F | Ht 61.5 in | Wt 126.4 lb

## 2012-02-26 DIAGNOSIS — C50919 Malignant neoplasm of unspecified site of unspecified female breast: Secondary | ICD-10-CM

## 2012-02-26 DIAGNOSIS — Z853 Personal history of malignant neoplasm of breast: Secondary | ICD-10-CM

## 2012-02-26 LAB — CBC WITH DIFFERENTIAL/PLATELET
Eosinophils Absolute: 0 10*3/uL (ref 0.0–0.5)
MCV: 102 fL — ABNORMAL HIGH (ref 79.5–101.0)
MONO%: 9.7 % (ref 0.0–14.0)
NEUT#: 3.7 10*3/uL (ref 1.5–6.5)
RBC: 4.37 10*6/uL (ref 3.70–5.45)
RDW: 13.3 % (ref 11.2–14.5)
WBC: 5.3 10*3/uL (ref 3.9–10.3)

## 2012-02-26 LAB — COMPREHENSIVE METABOLIC PANEL
AST: 29 U/L (ref 0–37)
Alkaline Phosphatase: 58 U/L (ref 39–117)
BUN: 9 mg/dL (ref 6–23)
Calcium: 9.7 mg/dL (ref 8.4–10.5)
Chloride: 106 mEq/L (ref 96–112)
Creatinine, Ser: 0.48 mg/dL — ABNORMAL LOW (ref 0.50–1.10)

## 2012-02-26 NOTE — Progress Notes (Signed)
[facility]  [phone] [phone]   OFFICE PROGRESS NOTE   Cc:  Currie Paris, MD  DIAGNOSIS: History of a 2.8 cm grade 2 invasive ductal carcinoma, negative margins with 3 out of 20 lymph node positive, ER negative, PR negative, HER2/neu negative with Ki-67 ratio of 95%, status post right breast mastectomy and right axillary lymph node dissection.   PAST THERAPY: adjuvant chemotherapy A/C --> T between 02/09/2010 and 05/17/2010. She also received adjuvant radiation.   CURRENT THERAPY: watchful observation.  INTERVAL HISTORY: Debbie Woods 51 y.o. female returns for regular follow up by herself.  She reports feeling well.  She had a normal mammogram in March 2013.  She saw Dr. Jamey Ripa within the past few weeks.  She performs self breast exam regular but has not found any abnormality.  She still works full time without fatigue.  Since last visit, she has been eating smaller portion and has some intentional weight loss. She was happy about this.   Patient denies fever, anorexia, fatigue, headache, visual changes, confusion, drenching night sweats, palpable lymph node swelling, mucositis, odynophagia, dysphagia, nausea vomiting, jaundice, chest pain, palpitation, shortness of breath, dyspnea on exertion, productive cough, gum bleeding, epistaxis, hematemesis, hemoptysis, abdominal pain, abdominal swelling, early satiety, melena, hematochezia, hematuria, skin rash, spontaneous bleeding, joint swelling, joint pain, heat or cold intolerance, bowel bladder incontinence, back pain, focal motor weakness, paresthesia, depression, suicidal or homocidal ideation, feeling hopelessness.   Past Medical History  Diagnosis Date  . Cancer   . Breast cancer, right, IDC, Stage II, Triple neg 11/16/2009    Her breast cancer was diagnosed on 11/16/2009. Was invasive ductal carcinoma. She underwent modified radical mastectomy on 12/21/2009. Invasive ductal carcinoma  measured 2.8 cm, margins were not involved, and there was metastatic carcinoma in three of 20 lymph nodes. It was triple negative with a Ki-67 of 95%.Subsequent chemo by Dr Gaylyn Rong and radiation by Dr Kathrynn Running     Past Surgical History  Procedure Date  . Mastectomy 2011    No current outpatient prescriptions on file.    ALLERGIES:  is allergic to latex.  REVIEW OF SYSTEMS:  The rest of the 14-point review of system was negative.   Filed Vitals:   02/26/12 0844  BP: 115/71  Pulse: 68  Temp: 98.6 F (37 C)   Wt Readings from Last 3 Encounters:  02/26/12 126 lb 6.4 oz (57.335 kg)  01/10/12 133 lb 12.8 oz (60.691 kg)  09/25/11 144 lb 4.8 oz (65.454 kg)   ECOG Performance status: 0  PHYSICAL EXAMINATION:   General:  Thin appearing woman, in no acute distress.  Eyes:  no scleral icterus.  ENT:  There were no oropharyngeal lesions.  Neck was without thyromegaly.  Lymphatics:  Negative cervical, supraclavicular or axillary adenopathy.  Respiratory: lungs were clear bilaterally without wheezing or crackles.  Cardiovascular:  Regular rate and rhythm, S1/S2, without murmur, rub or gallop.  There was no pedal edema.  GI:  abdomen was soft, flat, nontender, nondistended, without organomegaly.  Muscoloskeletal:  no spinal tenderness of palpation of vertebral spine.  Skin exam was without echymosis, petichae.  Neuro exam was nonfocal.  Patient was able to get on and off exam table without assistance.  Gait was normal.  Patient was alerted and oriented.  Attention was good.   Language was appropriate.  Mood was normal without depression.  Speech was not pressured.  Thought content was not tangential.   She again declined breast exam despite  my advise of it importance.         LABORATORY/RADIOLOGY DATA:  Lab Results  Component Value Date   WBC 5.3 02/26/2012   HGB 15.2 02/26/2012   HCT 44.6 02/26/2012   PLT 190 02/26/2012   GLUCOSE 74 09/25/2011   ALKPHOS 69 09/25/2011   ALT 16 09/25/2011   AST 33  09/25/2011   NA 145 09/25/2011   K 3.8 09/25/2011   CL 105 09/25/2011   CREATININE 0.54 09/25/2011   BUN 9 09/25/2011   CO2 28 09/25/2011   INR 0.9 03/05/2009    ASSESSMENT AND PLAN:   1. History of triple negative breast cancer. I discussed with [name] that today on clinical history, and laboratory tests, there was no evidence of recurrence or metastatic disease. She had a negative mammogram in March 2013.  Next surveillance mammogram is due in March 2014.  2. Primary care. She had colonoscopy with Dr. Juanda Chance and was negative in the past. The next one is reportedly due in 2014. Her last Pap smear was negative in August 2012. She does not meet criteria for bone density scan.  3. Weight loss:  Intentional.   4. Follow up with Cancer Center in about 6 months.           The length of time of the face-to-face encounter was 10 minutes. More than 50% of time was spent counseling and coordination of care.

## 2012-02-28 ENCOUNTER — Telehealth: Payer: Self-pay | Admitting: *Deleted

## 2012-02-28 NOTE — Telephone Encounter (Signed)
Made patient appointment 08-28-2012 lab only 02-25-2013 lab and md

## 2012-04-12 NOTE — Progress Notes (Signed)
Not applicable.  Before EPIC gone live.  

## 2012-05-11 ENCOUNTER — Other Ambulatory Visit (HOSPITAL_COMMUNITY)
Admission: RE | Admit: 2012-05-11 | Discharge: 2012-05-11 | Disposition: A | Discharge status: Home / Self Care | Payer: BC Managed Care – PPO | Source: Ambulatory Visit | Attending: Obstetrics & Gynecology | Admitting: Obstetrics & Gynecology

## 2012-05-11 ENCOUNTER — Other Ambulatory Visit: Payer: Self-pay | Admitting: Obstetrics & Gynecology

## 2012-05-11 DIAGNOSIS — Z01419 Encounter for gynecological examination (general) (routine) without abnormal findings: Secondary | ICD-10-CM | POA: Insufficient documentation

## 2012-08-27 ENCOUNTER — Telehealth: Payer: Self-pay | Admitting: Oncology

## 2012-08-27 NOTE — Telephone Encounter (Signed)
pt needed later lab appt ....Marland KitchenMarland KitchenDone

## 2012-08-28 ENCOUNTER — Other Ambulatory Visit: Payer: Self-pay | Admitting: Lab

## 2012-08-28 ENCOUNTER — Other Ambulatory Visit: Payer: Self-pay

## 2012-08-31 ENCOUNTER — Other Ambulatory Visit: Payer: Self-pay | Admitting: Lab

## 2012-09-28 ENCOUNTER — Other Ambulatory Visit: Payer: Self-pay | Admitting: Oncology

## 2012-09-28 DIAGNOSIS — Z1231 Encounter for screening mammogram for malignant neoplasm of breast: Secondary | ICD-10-CM

## 2012-09-30 ENCOUNTER — Other Ambulatory Visit: Payer: Self-pay | Admitting: Oncology

## 2012-09-30 ENCOUNTER — Telehealth: Payer: Self-pay | Admitting: Oncology

## 2012-09-30 DIAGNOSIS — Z853 Personal history of malignant neoplasm of breast: Secondary | ICD-10-CM

## 2012-09-30 NOTE — Telephone Encounter (Signed)
lvm for pt regarding to 3.12.14 appt

## 2012-10-14 ENCOUNTER — Other Ambulatory Visit: Payer: Self-pay | Admitting: Lab

## 2012-10-14 ENCOUNTER — Other Ambulatory Visit: Payer: Self-pay | Admitting: Oncology

## 2012-10-14 ENCOUNTER — Encounter: Payer: Self-pay | Admitting: Oncology

## 2012-10-14 NOTE — Progress Notes (Signed)
This encounter was created in error - please disregard.

## 2012-10-15 ENCOUNTER — Telehealth: Payer: Self-pay | Admitting: Oncology

## 2012-10-15 NOTE — Telephone Encounter (Signed)
lvm for the pt for 3.17.14

## 2012-10-19 ENCOUNTER — Encounter: Payer: BC Managed Care – PPO | Admitting: Oncology

## 2012-10-19 ENCOUNTER — Other Ambulatory Visit: Payer: BC Managed Care – PPO | Admitting: Lab

## 2012-10-19 ENCOUNTER — Ambulatory Visit: Payer: BC Managed Care – PPO | Admitting: Oncology

## 2012-10-19 NOTE — Progress Notes (Signed)
This encounter was created in error - please disregard.

## 2012-10-21 ENCOUNTER — Telehealth: Payer: Self-pay | Admitting: Oncology

## 2012-10-21 NOTE — Telephone Encounter (Signed)
S/w pt re appt for 4/11. appt moved from 4/9 to 4/11 due to she needs latest time poss.

## 2012-11-02 ENCOUNTER — Ambulatory Visit
Admission: RE | Admit: 2012-11-02 | Discharge: 2012-11-02 | Disposition: A | Discharge status: Home / Self Care | Payer: BC Managed Care – PPO | Source: Ambulatory Visit | Attending: Oncology | Admitting: Oncology

## 2012-11-02 DIAGNOSIS — Z1231 Encounter for screening mammogram for malignant neoplasm of breast: Secondary | ICD-10-CM

## 2012-11-09 ENCOUNTER — Telehealth: Payer: Self-pay | Admitting: Oncology

## 2012-11-11 ENCOUNTER — Other Ambulatory Visit: Payer: BC Managed Care – PPO | Admitting: Lab

## 2012-11-11 ENCOUNTER — Ambulatory Visit: Payer: BC Managed Care – PPO | Admitting: Oncology

## 2012-11-13 ENCOUNTER — Telehealth: Payer: Self-pay | Admitting: Oncology

## 2012-11-13 ENCOUNTER — Ambulatory Visit (HOSPITAL_BASED_OUTPATIENT_CLINIC_OR_DEPARTMENT_OTHER): Payer: BC Managed Care – PPO | Admitting: Oncology

## 2012-11-13 ENCOUNTER — Other Ambulatory Visit: Payer: BC Managed Care – PPO | Admitting: Lab

## 2012-11-13 ENCOUNTER — Encounter: Payer: Self-pay | Admitting: Oncology

## 2012-11-13 ENCOUNTER — Ambulatory Visit (HOSPITAL_BASED_OUTPATIENT_CLINIC_OR_DEPARTMENT_OTHER): Payer: BC Managed Care – PPO | Admitting: Lab

## 2012-11-13 VITALS — BP 133/88 | HR 82 | Temp 98.9°F | Resp 18 | Ht 61.0 in | Wt 134.7 lb

## 2012-11-13 DIAGNOSIS — C50419 Malignant neoplasm of upper-outer quadrant of unspecified female breast: Secondary | ICD-10-CM

## 2012-11-13 DIAGNOSIS — Z853 Personal history of malignant neoplasm of breast: Secondary | ICD-10-CM

## 2012-11-13 DIAGNOSIS — Z171 Estrogen receptor negative status [ER-]: Secondary | ICD-10-CM

## 2012-11-13 DIAGNOSIS — C773 Secondary and unspecified malignant neoplasm of axilla and upper limb lymph nodes: Secondary | ICD-10-CM

## 2012-11-13 LAB — CBC WITH DIFFERENTIAL/PLATELET
BASO%: 0.7 % (ref 0.0–2.0)
HCT: 42.2 % (ref 34.8–46.6)
MCHC: 33.5 g/dL (ref 31.5–36.0)
MONO#: 0.6 10*3/uL (ref 0.1–0.9)
NEUT%: 58.8 % (ref 38.4–76.8)
RDW: 13.4 % (ref 11.2–14.5)
WBC: 5.5 10*3/uL (ref 3.9–10.3)
lymph#: 1.6 10*3/uL (ref 0.9–3.3)

## 2012-11-13 LAB — COMPREHENSIVE METABOLIC PANEL (CC13)
ALT: 11 U/L (ref 0–55)
AST: 21 U/L (ref 5–34)
Albumin: 3.6 g/dL (ref 3.5–5.0)
CO2: 30 mEq/L — ABNORMAL HIGH (ref 22–29)
Calcium: 9.5 mg/dL (ref 8.4–10.4)
Chloride: 105 mEq/L (ref 98–107)
Creatinine: 0.7 mg/dL (ref 0.6–1.1)
Potassium: 4.1 mEq/L (ref 3.5–5.1)
Sodium: 142 mEq/L (ref 136–145)
Total Protein: 6.8 g/dL (ref 6.4–8.3)

## 2012-11-13 NOTE — Telephone Encounter (Signed)
Pt sent back to lb and given appt schedule for October. Per 4/11 pof July lb/fu cx'd.

## 2012-11-13 NOTE — Progress Notes (Signed)
[facility]  Telephone:([phone] Fax:([phone]   OFFICE PROGRESS NOTE   Cc:  Currie Paris, MD  DIAGNOSIS: History of a 2.8 cm grade 2 invasive ductal carcinoma, negative margins with 3 out of 20 lymph node positive, ER negative, PR negative, HER2/neu negative with Ki-67 ratio of 95%, status post right breast mastectomy and right axillary lymph node dissection.   PAST THERAPY: adjuvant chemotherapy A/C --> T between 02/09/2010 and 05/17/2010. She also received adjuvant radiation.   CURRENT THERAPY: watchful observation.  INTERVAL HISTORY: Debbie Woods 52 y.o. female returns for regular follow up by herself.  She reports feeling well.  She had a normal mammogram in March 2014.  She performs self breast exam regular but has not found any abnormality.  She still works full time without fatigue.   Patient denies fever, anorexia, fatigue, headache, visual changes, confusion, drenching night sweats, palpable lymph node swelling, mucositis, odynophagia, dysphagia, nausea vomiting, jaundice, chest pain, palpitation, shortness of breath, dyspnea on exertion, productive cough, gum bleeding, epistaxis, hematemesis, hemoptysis, abdominal pain, abdominal swelling, early satiety, melena, hematochezia, hematuria, skin rash, spontaneous bleeding, joint swelling, joint pain, heat or cold intolerance, bowel bladder incontinence, back pain, focal motor weakness, paresthesia, depression, suicidal or homocidal ideation, feeling hopelessness.   Past Medical History  Diagnosis Date  . Cancer   . Breast cancer, right, IDC, Stage II, Triple neg 11/16/2009    Her breast cancer was diagnosed on 11/16/2009. Was invasive ductal carcinoma. She underwent modified radical mastectomy on 12/21/2009. Invasive ductal carcinoma measured 2.8 cm, margins were not involved, and there was metastatic carcinoma in three of 20 lymph nodes. It was triple negative with a Ki-67 of 95%.Subsequent chemo by Dr  Gaylyn Rong and radiation by Dr Kathrynn Running     Past Surgical History  Procedure Laterality Date  . Mastectomy  2011    No current outpatient prescriptions on file.   No current facility-administered medications for this visit.    ALLERGIES:  is allergic to latex.  REVIEW OF SYSTEMS:  The rest of the 14-point review of system was negative.   Filed Vitals:   11/13/12 1440  BP: 133/88  Pulse: 82  Temp: 98.9 F (37.2 C)  Resp: 18   Wt Readings from Last 3 Encounters:  11/13/12 134 lb 11.2 oz (61.1 kg)  02/26/12 126 lb 6.4 oz (57.335 kg)  01/10/12 133 lb 12.8 oz (60.691 kg)   ECOG Performance status: 0  PHYSICAL EXAMINATION:   General:  Thin appearing woman, in no acute distress.  Eyes:  no scleral icterus.  ENT:  There were no oropharyngeal lesions.  Neck was without thyromegaly.  Lymphatics:  Negative cervical, supraclavicular or axillary adenopathy.  Respiratory: lungs were clear bilaterally without wheezing or crackles.  Cardiovascular:  Regular rate and rhythm, S1/S2, without murmur, rub or gallop.  There was no pedal edema.  GI:  abdomen was soft, flat, nontender, nondistended, without organomegaly.  Muscoloskeletal:  no spinal tenderness of palpation of vertebral spine.  Skin exam was without echymosis, petichae.  Neuro exam was nonfocal.  Patient was able to get on and off exam table without assistance.  Gait was normal.  Patient was alerted and oriented.  Attention was good.   Language was appropriate.  Mood was normal without depression.  Speech was not pressured.  Thought content was not tangential.   Breast exam: S/P right mastectomy. No masses noted. Left breast exam showed no masses.      LABORATORY/RADIOLOGY DATA:  Lab  Results  Component Value Date   WBC 5.5 11/13/2012   HGB 14.1 11/13/2012   HCT 42.2 11/13/2012   PLT 200 11/13/2012   GLUCOSE 79 02/26/2012   ALKPHOS 58 02/26/2012   ALT 20 02/26/2012   AST 29 02/26/2012   NA 143 02/26/2012   K 4.2 02/26/2012   CL 106 02/26/2012    CREATININE 0.48* 02/26/2012   BUN 9 02/26/2012   CO2 23 02/26/2012   INR 0.9 03/05/2009    ASSESSMENT AND PLAN:   1. History of triple negative breast cancer. I discussed with [name] that today on clinical history, and laboratory tests, there was no evidence of recurrence or metastatic disease. She had a negative mammogram in March 2014.  Next surveillance mammogram is due in March 2015.  2. Primary care. She had colonoscopy with Dr. Juanda Chance that was negative in August 2010. States she is up to date with her Pap smear. She does not meet criteria for bone density scan.  3. Follow up with Cancer Center in about 6 months.           The length of time of the face-to-face encounter was 15 minutes. More than 50% of time was spent counseling and coordination of care.

## 2012-11-16 ENCOUNTER — Telehealth: Payer: Self-pay | Admitting: *Deleted

## 2012-11-16 NOTE — Telephone Encounter (Signed)
Message copied by Wende Mott on Mon Nov 16, 2012  3:34 PM ------      Message from: Clenton Pare R      Created: Mon Nov 16, 2012  2:15 PM      Regarding: Lab results       Please call pt and let her know that her labs are normal. Her results had not come back before she left here on Friday. Thanks. ------

## 2012-11-16 NOTE — Telephone Encounter (Signed)
Left VM for pt informing of labs wnl,  Call us back if any questions.

## 2013-02-08 ENCOUNTER — Other Ambulatory Visit: Payer: BC Managed Care – PPO | Admitting: Lab

## 2013-02-08 ENCOUNTER — Ambulatory Visit: Payer: Self-pay | Admitting: Oncology

## 2013-02-15 ENCOUNTER — Ambulatory Visit: Payer: Self-pay | Admitting: Oncology

## 2013-02-15 ENCOUNTER — Other Ambulatory Visit: Payer: BC Managed Care – PPO | Admitting: Lab

## 2013-02-25 ENCOUNTER — Ambulatory Visit: Payer: Self-pay | Admitting: Oncology

## 2013-02-25 ENCOUNTER — Other Ambulatory Visit: Payer: Self-pay | Admitting: Lab

## 2013-04-14 ENCOUNTER — Other Ambulatory Visit: Payer: Self-pay | Admitting: Dermatology

## 2013-05-03 ENCOUNTER — Encounter (INDEPENDENT_AMBULATORY_CARE_PROVIDER_SITE_OTHER): Payer: Self-pay

## 2013-05-10 ENCOUNTER — Telehealth: Payer: Self-pay | Admitting: Hematology and Oncology

## 2013-05-10 NOTE — Telephone Encounter (Signed)
Called pt re new time for 10/9 lb/fu. Per pt appt moved from 10/9 to 11/3 due to change in work schedule. Pt has new d/t for lb/NG 11/3 @ 2:45pm. Pt needs latest appt possible.

## 2013-05-11 ENCOUNTER — Other Ambulatory Visit: Payer: Self-pay | Admitting: Obstetrics & Gynecology

## 2013-05-12 ENCOUNTER — Other Ambulatory Visit (HOSPITAL_COMMUNITY)
Admission: RE | Admit: 2013-05-12 | Discharge: 2013-05-12 | Disposition: A | Discharge status: Home / Self Care | Payer: BC Managed Care – PPO | Source: Ambulatory Visit | Attending: Obstetrics & Gynecology | Admitting: Obstetrics & Gynecology

## 2013-05-12 ENCOUNTER — Encounter: Payer: Self-pay | Admitting: Obstetrics & Gynecology

## 2013-05-12 ENCOUNTER — Ambulatory Visit (INDEPENDENT_AMBULATORY_CARE_PROVIDER_SITE_OTHER): Payer: BC Managed Care – PPO | Admitting: Obstetrics & Gynecology

## 2013-05-12 ENCOUNTER — Other Ambulatory Visit: Payer: Self-pay | Admitting: Obstetrics & Gynecology

## 2013-05-12 VITALS — BP 128/80 | Ht 60.0 in | Wt 133.0 lb

## 2013-05-12 DIAGNOSIS — Z01419 Encounter for gynecological examination (general) (routine) without abnormal findings: Secondary | ICD-10-CM | POA: Insufficient documentation

## 2013-05-12 DIAGNOSIS — Z1151 Encounter for screening for human papillomavirus (HPV): Secondary | ICD-10-CM | POA: Insufficient documentation

## 2013-05-12 NOTE — Progress Notes (Signed)
Patient ID: Debbie Woods, female   DOB: Feb 22, 1961, 52 y.o.   MRN: [id number] Subjective:     Debbie Woods is a 52 y.o. female here for a routine exam.  No LMP recorded. Patient is postmenopausal. No obstetric history on file. Current complaints: none. .   Gynecologic History No LMP recorded. Patient is postmenopausal. Contraception: post menopausal status Last Pap: 2013. Results were: normal Last mammogram: 2014. Results were: normal  Past Medical History  Diagnosis Date  . Cancer   . Breast cancer, right, IDC, Stage II, Triple neg 11/16/2009    Her breast cancer was diagnosed on 11/16/2009. Was invasive ductal carcinoma. She underwent modified radical mastectomy on 12/21/2009. Invasive ductal carcinoma measured 2.8 cm, margins were not involved, and there was metastatic carcinoma in three of 20 lymph nodes. It was triple negative with a Ki-67 of 95%.Subsequent chemo by Dr Gaylyn Rong and radiation by Dr Kathrynn Running     Past Surgical History  Procedure Laterality Date  . Mastectomy  2011    OB History   Grav Para Term Preterm Abortions TAB SAB Ect Mult Living                  History   Social History  . Marital Status: Single    Spouse Name: N/A    Number of Children: N/A  . Years of Education: N/A   Social History Main Topics  . Smoking status: Current Some Day Smoker -- 0.50 packs/day  . Smokeless tobacco: Never Used     Comment: 4-5 CIGS A DAY  . Alcohol Use: Yes     Comment: SOCIAL  . Drug Use: No  . Sexual Activity:    Other Topics Concern  . None   Social History Narrative  . None    History reviewed. No pertinent family history.   Review of Systems  Review of Systems  Constitutional: Negative for fever, chills, weight loss, malaise/fatigue and diaphoresis.  HENT: Negative for hearing loss, ear pain, nosebleeds, congestion, sore throat, neck pain, tinnitus and ear discharge.   Eyes: Negative for blurred vision, double vision, photophobia, pain, discharge and  redness.  Respiratory: Negative for cough, hemoptysis, sputum production, shortness of breath, wheezing and stridor.   Cardiovascular: Negative for chest pain, palpitations, orthopnea, claudication, leg swelling and PND.  Gastrointestinal: negative for abdominal pain. Negative for heartburn, nausea, vomiting, diarrhea, constipation, blood in stool and melena.  Genitourinary: Negative for dysuria, urgency, frequency, hematuria and flank pain.  Musculoskeletal: Negative for myalgias, back pain, joint pain and falls.  Skin: Negative for itching and rash.  Neurological: Negative for dizziness, tingling, tremors, sensory change, speech change, focal weakness, seizures, loss of consciousness, weakness and headaches.  Endo/Heme/Allergies: Negative for environmental allergies and polydipsia. Does not bruise/bleed easily.  Psychiatric/Behavioral: Negative for depression, suicidal ideas, hallucinations, memory loss and substance abuse. The patient is not nervous/anxious and does not have insomnia.        Objective:    Physical Exam  Vitals reviewed. Constitutional: She is oriented to person, place, and time. She appears well-developed and well-nourished.  HENT:  Head: Normocephalic and atraumatic.        Right Ear: External ear normal.  Left Ear: External ear normal.  Nose: Nose normal.  Mouth/Throat: Oropharynx is clear and moist.  Eyes: Conjunctivae and EOM are normal. Pupils are equal, round, and reactive to light. Right eye exhibits no discharge. Left eye exhibits no discharge. No scleral icterus.  Neck: Normal range of motion. Neck supple.  No tracheal deviation present. No thyromegaly present.  Cardiovascular: Normal rate, regular rhythm, normal heart sounds and intact distal pulses.  Exam reveals no gallop and no friction rub.   No murmur heard. Respiratory: Effort normal and breath sounds normal. No respiratory distress. She has no wheezes. She has no rales. She exhibits no tenderness.  GI:  Soft. Bowel sounds are normal. She exhibits no distension and no mass. There is no tenderness. There is no rebound and no guarding.  Genitourinary:  Breasts status post right mod rad mastectomy, left breast without masses skin changes or nipple changes      Vulva is normal without lesions Vagina is pink moist without discharge Cervix normal in appearance and pap is done Uterus is normal size shape and contour Adnexa is negative with normal sized ovaries  Rectal   Pt declined Musculoskeletal: Normal range of motion. She exhibits no edema and no tenderness.  Neurological: She is alert and oriented to person, place, and time. She has normal reflexes. She displays normal reflexes. No cranial nerve deficit. She exhibits normal muscle tone. Coordination normal.  Skin: Skin is warm and dry. No rash noted. No erythema. No pallor.  Psychiatric: She has a normal mood and affect. Her behavior is normal. Judgment and thought content normal.       Assessment:    Normal yearly gyn exam   Plan:    Follow up in: 1 year.

## 2013-05-13 ENCOUNTER — Ambulatory Visit: Payer: BC Managed Care – PPO | Admitting: Hematology and Oncology

## 2013-05-13 ENCOUNTER — Other Ambulatory Visit: Payer: BC Managed Care – PPO | Admitting: Lab

## 2013-06-07 ENCOUNTER — Encounter: Payer: Self-pay | Admitting: Hematology and Oncology

## 2013-06-07 ENCOUNTER — Other Ambulatory Visit (HOSPITAL_BASED_OUTPATIENT_CLINIC_OR_DEPARTMENT_OTHER): Payer: BC Managed Care – PPO | Admitting: Lab

## 2013-06-07 ENCOUNTER — Telehealth: Payer: Self-pay | Admitting: Hematology and Oncology

## 2013-06-07 ENCOUNTER — Ambulatory Visit (HOSPITAL_BASED_OUTPATIENT_CLINIC_OR_DEPARTMENT_OTHER): Payer: BC Managed Care – PPO | Admitting: Hematology and Oncology

## 2013-06-07 VITALS — BP 138/85 | HR 72 | Temp 98.4°F | Resp 19 | Ht 60.0 in | Wt 136.3 lb

## 2013-06-07 DIAGNOSIS — Z853 Personal history of malignant neoplasm of breast: Secondary | ICD-10-CM

## 2013-06-07 DIAGNOSIS — C50619 Malignant neoplasm of axillary tail of unspecified female breast: Secondary | ICD-10-CM

## 2013-06-07 DIAGNOSIS — C773 Secondary and unspecified malignant neoplasm of axilla and upper limb lymph nodes: Secondary | ICD-10-CM

## 2013-06-07 DIAGNOSIS — Z171 Estrogen receptor negative status [ER-]: Secondary | ICD-10-CM

## 2013-06-07 LAB — COMPREHENSIVE METABOLIC PANEL (CC13)
ALT: 11 U/L (ref 0–55)
AST: 21 U/L (ref 5–34)
Anion Gap: 12 mEq/L — ABNORMAL HIGH (ref 3–11)
CO2: 26 mEq/L (ref 22–29)
Calcium: 9.8 mg/dL (ref 8.4–10.4)
Chloride: 103 mEq/L (ref 98–109)
Creatinine: 0.6 mg/dL (ref 0.6–1.1)
Sodium: 142 mEq/L (ref 136–145)
Total Protein: 7 g/dL (ref 6.4–8.3)

## 2013-06-07 LAB — CBC WITH DIFFERENTIAL/PLATELET
BASO%: 0.5 % (ref 0.0–2.0)
EOS%: 0.8 % (ref 0.0–7.0)
MCH: 33.3 pg (ref 25.1–34.0)
MCHC: 32.8 g/dL (ref 31.5–36.0)
MONO#: 0.6 10*3/uL (ref 0.1–0.9)
RBC: 4.33 10*6/uL (ref 3.70–5.45)
RDW: 13.4 % (ref 11.2–14.5)
WBC: 6.5 10*3/uL (ref 3.9–10.3)
lymph#: 2.2 10*3/uL (ref 0.9–3.3)

## 2013-06-07 NOTE — Telephone Encounter (Signed)
gv and printed appt sched and avs for pt for May 2015 °

## 2013-06-07 NOTE — Progress Notes (Signed)
[facility] OFFICE PROGRESS NOTE  Patient Care Team: Currie Paris, MD as PCP - General (General Surgery)  DIAGNOSIS: Left breast cancer, T2, N1, M0 ER/PR HER-2/neu negative for further management  SUMMARY OF ONCOLOGIC HISTORY: This is a pleasant 52 year old lady with the diagnosis of locally advanced right breast cancer triple receptor negative. The patient was diagnosed after she saw a mass and palpated a lump on the right breast. She subsequently underwent right breast mastectomy and axillary lymph node dissection. The patient received adjuvant chemotherapy with Adriamycin Cytoxan followed by Taxol and radiation therapy. She completed chemotherapy between July 2011 to October 2011.  INTERVAL HISTORY: Debbie Woods 52 y.o. female returns for further followup. She perform regular self breast examination and did not find any abnormalities. The patient has no menstruation due to prior procedure to her endometrium. She still has ovaries intact. Her last colonoscopy was 4 years ago. Her last mammogram was in March of 2014. She denies any recent fever, chills, night sweats or abnormal weight loss  I have reviewed the past medical history, past surgical history, social history and family history with the patient and they are unchanged from previous note.  ALLERGIES:  is allergic to latex.  MEDICATIONS:  No current outpatient prescriptions on file.   No current facility-administered medications for this visit.    REVIEW OF SYSTEMS:   Constitutional: Denies fevers, chills or abnormal weight loss Eyes: Denies blurriness of vision Ears, nose, mouth, throat, and face: Denies mucositis or sore throat Respiratory: Denies cough, dyspnea or wheezes Cardiovascular: Denies palpitation, chest discomfort or lower extremity swelling Gastrointestinal:  Denies nausea, heartburn or change in bowel habits Skin: Denies abnormal skin rashes Lymphatics: Denies new lymphadenopathy  or easy bruising Neurological:Denies numbness, tingling or new weaknesses Behavioral/Psych: Mood is stable, no new changes  All other systems were reviewed with the patient and are negative.  PHYSICAL EXAMINATION: ECOG PERFORMANCE STATUS: 0 - Asymptomatic  Filed Vitals:   06/07/13 1505  BP: 138/85  Pulse: 72  Temp: 98.4 F (36.9 C)  Resp: 19   Filed Weights   06/07/13 1505  Weight: 136 lb 4.8 oz (61.825 kg)    GENERAL:alert, no distress and comfortable SKIN: skin color, texture, turgor are normal, no rashes or significant lesions EYES: normal, Conjunctiva are pink and non-injected, sclera clear OROPHARYNX:no exudate, no erythema and lips, buccal mucosa, and tongue normal  NECK: supple, thyroid normal size, non-tender, without nodularity LYMPH:  no palpable lymphadenopathy in the cervical, axillary or inguinal LUNGS: clear to auscultation and percussion with normal breathing effort HEART: regular rate & rhythm and no murmurs and no lower extremity edema ABDOMEN:abdomen soft, non-tender and normal bowel sounds Musculoskeletal:no cyanosis of digits and no clubbing  NEURO: alert & oriented x 3 with fluent speech, no focal motor/sensory deficits Bilateral breast examination were performed. There is well-healed mastectomy scar on the right with no palpable abnormalities. Normal breast exam on the left. LABORATORY DATA:  I have reviewed the data as listed    Component Value Date/Time   NA 142 11/13/2012 1523   NA 143 02/26/2012 0820   K 4.1 11/13/2012 1523   K 4.2 02/26/2012 0820   CL 105 11/13/2012 1523   CL 106 02/26/2012 0820   CO2 30* 11/13/2012 1523   CO2 23 02/26/2012 0820   GLUCOSE 99 11/13/2012 1523   GLUCOSE 79 02/26/2012 0820   BUN 14.7 11/13/2012 1523   BUN 9 02/26/2012 0820   CREATININE 0.7 11/13/2012 1523  CREATININE 0.48* 02/26/2012 0820   CALCIUM 9.5 11/13/2012 1523   CALCIUM 9.7 02/26/2012 0820   PROT 6.8 11/13/2012 1523   PROT 6.7 02/26/2012 0820   ALBUMIN 3.6  11/13/2012 1523   ALBUMIN 4.5 02/26/2012 0820   AST 21 11/13/2012 1523   AST 29 02/26/2012 0820   ALT 11 11/13/2012 1523   ALT 20 02/26/2012 0820   ALKPHOS 70 11/13/2012 1523   ALKPHOS 58 02/26/2012 0820   BILITOT 0.42 11/13/2012 1523   BILITOT 0.4 02/26/2012 0820   GFRNONAA >60 12/18/2009 1503   GFRAA  Value: >60        The eGFR has been calculated using the MDRD equation. This calculation has not been validated in all clinical situations. eGFR's persistently <60 mL/min signify possible Chronic Kidney Disease. 12/18/2009 1503    No results found for this basename: SPEP, UPEP,  kappa and lambda light chains    Lab Results  Component Value Date   WBC 6.5 06/07/2013   NEUTROABS 3.7 06/07/2013   HGB 14.4 06/07/2013   HCT 44.0 06/07/2013   MCV 101.6* 06/07/2013   PLT 214 06/07/2013      Chemistry      Component Value Date/Time   NA 142 11/13/2012 1523   NA 143 02/26/2012 0820   K 4.1 11/13/2012 1523   K 4.2 02/26/2012 0820   CL 105 11/13/2012 1523   CL 106 02/26/2012 0820   CO2 30* 11/13/2012 1523   CO2 23 02/26/2012 0820   BUN 14.7 11/13/2012 1523   BUN 9 02/26/2012 0820   CREATININE 0.7 11/13/2012 1523   CREATININE 0.48* 02/26/2012 0820      Component Value Date/Time   CALCIUM 9.5 11/13/2012 1523   CALCIUM 9.7 02/26/2012 0820   ALKPHOS 70 11/13/2012 1523   ALKPHOS 58 02/26/2012 0820   AST 21 11/13/2012 1523   AST 29 02/26/2012 0820   ALT 11 11/13/2012 1523   ALT 20 02/26/2012 0820   BILITOT 0.42 11/13/2012 1523   BILITOT 0.4 02/26/2012 0820       RADIOGRAPHIC STUDIES: I have personally reviewed the radiological images as listed and agreed with the findings in the report. Mammogram is normal from March 2014   ASSESSMENT: right breast cancer T2, N1, M0 triple receptor negative, no evidence of disease  PLAN:  #1 right breast cancer The patient was diagnosed with triple receptor negative breast cancer at a young age. Per NCCN guidelines, she will qualify for genetic counseling and consideration  for testing to rule out BRCA mutation. I recommend history and physical examination every 6 months and yearly mammogram. I discussed with her the benefits of genetic counseling and screening but the patient declined and elected to think about it. #2 early menopause I recommend calcium and vitamin D supplements. The patient would like to think about it #3 smoking history At discussion about the importance of nicotine cessation. The patient would like to try on her own #4 macrocytosis I suspect this is due to alcoholism. The patient admitted that she drinks alcohol the night before. We will repeat her blood work again in 6 months #5 preventive care We discussed the importance of preventive care and reviewed the vaccination programs. She does not have any prior allergic reactions to influenza vaccination. She declined influenza vaccination today.  Orders Placed This Encounter  Procedures  . CBC with Differential    Standing Status: Future     Number of Occurrences:      Standing Expiration Date: 02/27/2014  .  Comprehensive metabolic panel    Standing Status: Future     Number of Occurrences:      Standing Expiration Date: 06/07/2014   All questions were answered. The patient knows to call the clinic with any problems, questions or concerns. No barriers to learning was detected.    Juanangel Soderholm, MD 06/07/2013 3:20 PM

## 2013-09-03 ENCOUNTER — Telehealth: Payer: Self-pay | Admitting: *Deleted

## 2013-09-03 NOTE — Telephone Encounter (Signed)
Pt left VM states wants to change her appts on 5/04 to another date, preferably a Friday.  POF sent to scheduler.

## 2013-09-06 ENCOUNTER — Telehealth: Payer: Self-pay | Admitting: Hematology and Oncology

## 2013-09-06 NOTE — Telephone Encounter (Signed)
S/w the pt and she is aware of her r/s appts from 12/06/2013 to 12/17/2013 due to the pt requested a Friday appt.

## 2013-10-12 ENCOUNTER — Other Ambulatory Visit: Payer: Self-pay

## 2013-10-12 DIAGNOSIS — Z9011 Acquired absence of right breast and nipple: Secondary | ICD-10-CM

## 2013-10-12 DIAGNOSIS — Z1231 Encounter for screening mammogram for malignant neoplasm of breast: Secondary | ICD-10-CM

## 2013-11-08 ENCOUNTER — Ambulatory Visit
Admission: RE | Admit: 2013-11-08 | Discharge: 2013-11-08 | Disposition: A | Discharge status: Home / Self Care | Payer: BC Managed Care – PPO | Source: Ambulatory Visit

## 2013-11-08 DIAGNOSIS — Z9011 Acquired absence of right breast and nipple: Secondary | ICD-10-CM

## 2013-11-08 DIAGNOSIS — Z1231 Encounter for screening mammogram for malignant neoplasm of breast: Secondary | ICD-10-CM

## 2013-12-06 ENCOUNTER — Ambulatory Visit: Payer: BC Managed Care – PPO | Admitting: Hematology and Oncology

## 2013-12-06 ENCOUNTER — Other Ambulatory Visit: Payer: BC Managed Care – PPO

## 2013-12-13 ENCOUNTER — Ambulatory Visit: Payer: BC Managed Care – PPO | Admitting: Hematology and Oncology

## 2013-12-17 ENCOUNTER — Other Ambulatory Visit (HOSPITAL_BASED_OUTPATIENT_CLINIC_OR_DEPARTMENT_OTHER): Payer: BC Managed Care – PPO

## 2013-12-17 ENCOUNTER — Ambulatory Visit (HOSPITAL_BASED_OUTPATIENT_CLINIC_OR_DEPARTMENT_OTHER): Payer: BC Managed Care – PPO | Admitting: Hematology and Oncology

## 2013-12-17 ENCOUNTER — Encounter: Payer: Self-pay | Admitting: Hematology and Oncology

## 2013-12-17 VITALS — BP 134/89 | HR 97 | Temp 98.0°F | Resp 18 | Ht 60.0 in | Wt 130.4 lb

## 2013-12-17 DIAGNOSIS — R7989 Other specified abnormal findings of blood chemistry: Secondary | ICD-10-CM

## 2013-12-17 DIAGNOSIS — Z853 Personal history of malignant neoplasm of breast: Secondary | ICD-10-CM

## 2013-12-17 DIAGNOSIS — F17200 Nicotine dependence, unspecified, uncomplicated: Secondary | ICD-10-CM

## 2013-12-17 LAB — CBC WITH DIFFERENTIAL/PLATELET
BASO%: 0.4 % (ref 0.0–2.0)
Basophils Absolute: 0 10*3/uL (ref 0.0–0.1)
EOS ABS: 0 10*3/uL (ref 0.0–0.5)
EOS%: 0.2 % (ref 0.0–7.0)
HCT: 42.9 % (ref 34.8–46.6)
HGB: 14.5 g/dL (ref 11.6–15.9)
LYMPH%: 33.6 % (ref 14.0–49.7)
MCH: 33.2 pg (ref 25.1–34.0)
MCHC: 33.8 g/dL (ref 31.5–36.0)
MCV: 98.2 fL (ref 79.5–101.0)
MONO#: 0.6 10*3/uL (ref 0.1–0.9)
MONO%: 11.6 % (ref 0.0–14.0)
NEUT%: 54.2 % (ref 38.4–76.8)
NEUTROS ABS: 2.6 10*3/uL (ref 1.5–6.5)
PLATELETS: 192 10*3/uL (ref 145–400)
RBC: 4.37 10*6/uL (ref 3.70–5.45)
RDW: 13.2 % (ref 11.2–14.5)
WBC: 4.8 10*3/uL (ref 3.9–10.3)
lymph#: 1.6 10*3/uL (ref 0.9–3.3)

## 2013-12-17 LAB — COMPREHENSIVE METABOLIC PANEL (CC13)
ALBUMIN: 3.8 g/dL (ref 3.5–5.0)
ALT: 11 U/L (ref 0–55)
ANION GAP: 13 meq/L — AB (ref 3–11)
AST: 15 U/L (ref 5–34)
Alkaline Phosphatase: 63 U/L (ref 40–150)
BUN: 9.5 mg/dL (ref 7.0–26.0)
CALCIUM: 10 mg/dL (ref 8.4–10.4)
CHLORIDE: 105 meq/L (ref 98–109)
CO2: 24 meq/L (ref 22–29)
Creatinine: 0.6 mg/dL (ref 0.6–1.1)
GLUCOSE: 112 mg/dL (ref 70–140)
POTASSIUM: 3.7 meq/L (ref 3.5–5.1)
SODIUM: 143 meq/L (ref 136–145)
TOTAL PROTEIN: 6.7 g/dL (ref 6.4–8.3)
Total Bilirubin: 0.33 mg/dL (ref 0.20–1.20)

## 2013-12-17 NOTE — Progress Notes (Signed)
Debbie Woods OFFICE PROGRESS NOTE  Patient Care Team: Haywood Lasso, MD as PCP - General (General Surgery)  DIAGNOSIS: Right breast cancer status post mastectomy, no evidence of recurrence  SUMMARY OF ONCOLOGIC HISTORY: This is a pleasant 53 year old lady with the diagnosis of locally advanced right breast cancer triple receptor negative. The patient was diagnosed after she saw a mass and palpated a lump on the right breast. She subsequently underwent right breast mastectomy and axillary lymph node dissection. The patient received adjuvant chemotherapy with Adriamycin Cytoxan followed by Taxol and radiation therapy. She completed chemotherapy between July 2011 to October 2011. Final staging was T2, N1, M0, ER/PR HER-2/neu negative.  INTERVAL HISTORY: Debbie Woods 53 y.o. female returns for further followup. She denies any recent abnormal breast examination, palpable mass, abnormal breast appearance or nipple changes She is attempting to quit smoking. She feels well otherwise.  I have reviewed the past medical history, past surgical history, social history and family history with the patient and they are unchanged from previous note.  ALLERGIES:  is allergic to latex.  MEDICATIONS:  No current outpatient prescriptions on file.   No current facility-administered medications for this visit.    REVIEW OF SYSTEMS:   Constitutional: Denies fevers, chills or abnormal weight loss Eyes: Denies blurriness of vision Ears, nose, mouth, throat, and face: Denies mucositis or sore throat Respiratory: Denies cough, dyspnea or wheezes Cardiovascular: Denies palpitation, chest discomfort or lower extremity swelling Gastrointestinal:  Denies nausea, heartburn or change in bowel habits Skin: Denies abnormal skin rashes Lymphatics: Denies new lymphadenopathy or easy bruising Neurological:Denies numbness, tingling or new weaknesses Behavioral/Psych: Mood is stable, no new  changes  All other systems were reviewed with the patient and are negative.  PHYSICAL EXAMINATION: ECOG PERFORMANCE STATUS: 0 - Asymptomatic  Filed Vitals:   12/17/13 1444  BP: 134/89  Pulse: 97  Temp: 98 F (36.7 C)  Resp: 18   Filed Weights   12/17/13 1444  Weight: 130 lb 6.4 oz (59.149 kg)    GENERAL:alert, no distress and comfortable. She looks older than stated age SKIN: skin color, texture, turgor are normal, no rashes or significant lesions EYES: normal, Conjunctiva are pink and non-injected, sclera clear OROPHARYNX:no exudate, no erythema and lips, buccal mucosa, and tongue normal  NECK: supple, thyroid normal size, non-tender, without nodularity LYMPH:  no palpable lymphadenopathy in the cervical, axillary or inguinal LUNGS: clear to auscultation and percussion with normal breathing effort HEART: regular rate & rhythm and no murmurs and no lower extremity edema ABDOMEN:abdomen soft, non-tender and normal bowel sounds Musculoskeletal:no cyanosis of digits and no clubbing  NEURO: alert & oriented x 3 with fluent speech, no focal motor/sensory deficits Her right chest wall is consistent with mastectomy scar. No abnormalities. Normal breast exam on the left. LABORATORY DATA:  I have reviewed the data as listed    Component Value Date/Time   NA 143 12/17/2013 1429   NA 143 02/26/2012 0820   K 3.7 12/17/2013 1429   K 4.2 02/26/2012 0820   CL 105 11/13/2012 1523   CL 106 02/26/2012 0820   CO2 24 12/17/2013 1429   CO2 23 02/26/2012 0820   GLUCOSE 112 12/17/2013 1429   GLUCOSE 99 11/13/2012 1523   GLUCOSE 79 02/26/2012 0820   BUN 9.5 12/17/2013 1429   BUN 9 02/26/2012 0820   CREATININE 0.6 12/17/2013 1429   CREATININE 0.48* 02/26/2012 0820   CALCIUM 10.0 12/17/2013 1429   CALCIUM 9.7 02/26/2012 0820  PROT 6.7 12/17/2013 1429   PROT 6.7 02/26/2012 0820   ALBUMIN 3.8 12/17/2013 1429   ALBUMIN 4.5 02/26/2012 0820   AST 15 12/17/2013 1429   AST 29 02/26/2012 0820   ALT 11 12/17/2013  1429   ALT 20 02/26/2012 0820   ALKPHOS 63 12/17/2013 1429   ALKPHOS 58 02/26/2012 0820   BILITOT 0.33 12/17/2013 1429   BILITOT 0.4 02/26/2012 0820   GFRNONAA >60 12/18/2009 1503   GFRAA  Value: >60        The eGFR has been calculated using the MDRD equation. This calculation has not been validated in all clinical situations. eGFR's persistently <60 mL/min signify possible Chronic Kidney Disease. 12/18/2009 1503    No results found for this basename: SPEP,  UPEP,   kappa and lambda light chains    Lab Results  Component Value Date   WBC 4.8 12/17/2013   NEUTROABS 2.6 12/17/2013   HGB 14.5 12/17/2013   HCT 42.9 12/17/2013   MCV 98.2 12/17/2013   PLT 192 12/17/2013      Chemistry      Component Value Date/Time   NA 143 12/17/2013 1429   NA 143 02/26/2012 0820   K 3.7 12/17/2013 1429   K 4.2 02/26/2012 0820   CL 105 11/13/2012 1523   CL 106 02/26/2012 0820   CO2 24 12/17/2013 1429   CO2 23 02/26/2012 0820   BUN 9.5 12/17/2013 1429   BUN 9 02/26/2012 0820   CREATININE 0.6 12/17/2013 1429   CREATININE 0.48* 02/26/2012 0820      Component Value Date/Time   CALCIUM 10.0 12/17/2013 1429   CALCIUM 9.7 02/26/2012 0820   ALKPHOS 63 12/17/2013 1429   ALKPHOS 58 02/26/2012 0820   AST 15 12/17/2013 1429   AST 29 02/26/2012 0820   ALT 11 12/17/2013 1429   ALT 20 02/26/2012 0820   BILITOT 0.33 12/17/2013 1429   BILITOT 0.4 02/26/2012 0820    ASSESSMENT & PLAN:  #1 right breast cancer The patient was diagnosed with triple receptor negative breast cancer at a young age. Per NCCN guidelines, she will qualify for genetic counseling and consideration for testing to rule out BRCA mutation. I recommend history and physical examination and yearly mammogram. I discussed with her the benefits of genetic counseling and screening but the patient declined and elected to think about it. #2 early menopause I recommend calcium and vitamin D supplements. The patient would like to think about it #3 smoking history At  discussion about the importance of nicotine cessation. The patient would like to try on her own #4 macrocytosis I suspect this is due to alcoholism. This has resolved.    Orders Placed This Encounter  Procedures  . CBC with Differential    Standing Status: Future     Number of Occurrences:      Standing Expiration Date: 12/17/2014  . Comprehensive metabolic panel    Standing Status: Future     Number of Occurrences:      Standing Expiration Date: 12/17/2014   All questions were answered. The patient knows to call the clinic with any problems, questions or concerns. No barriers to learning was detected. I spent 15 minutes counseling the patient face to face. The total time spent in the appointment was 20 minutes and more than 50% was on counseling and review of test results     Heath Lark, MD 12/17/2013 3:32 PM

## 2013-12-19 ENCOUNTER — Telehealth: Payer: Self-pay | Admitting: Hematology and Oncology

## 2013-12-19 NOTE — Telephone Encounter (Signed)
s.w. pt and advised on May 2016 appt.....pt ok and aware °

## 2014-05-12 ENCOUNTER — Ambulatory Visit (INDEPENDENT_AMBULATORY_CARE_PROVIDER_SITE_OTHER): Payer: BC Managed Care – PPO | Admitting: Family Medicine

## 2014-05-12 ENCOUNTER — Encounter: Payer: Self-pay | Admitting: Family Medicine

## 2014-05-12 VITALS — BP 124/76 | HR 68 | Temp 98.8°F | Resp 18 | Ht 61.5 in | Wt 130.0 lb

## 2014-05-12 DIAGNOSIS — J069 Acute upper respiratory infection, unspecified: Secondary | ICD-10-CM

## 2014-05-12 DIAGNOSIS — H6123 Impacted cerumen, bilateral: Secondary | ICD-10-CM

## 2014-05-12 MED ORDER — CETIRIZINE-PSEUDOEPHEDRINE ER 5-120 MG PO TB12: 1.0000 | ORAL_TABLET | Freq: Two times a day (BID) | ORAL | Status: DC

## 2014-05-12 NOTE — Progress Notes (Signed)
Subjective:    Patient ID: Debbie Woods, female    DOB: [birth year], 53 y.o.   MRN: [id number]  HPI Patient is a very pleasant 53 year old white female who presents today with a one-week history of decreased hearing and pressure in the ears. She has a past medical history of cerumen impactions requiring ear lavage to clear. She suspects that she needs this again. She denies any otalgia. She denies any vertigo. Recently she's also been having sinus congestion and rhinorrhea. She reports green to yellow congestion whenever she blows her nose. She denies any epistaxis. She denies any sinus pain. She denies any fevers or chills. She denies any cough or sore throat. Past Medical History  Diagnosis Date  . Cancer   . Breast cancer, right, IDC, Stage II, Triple neg 11/16/2009    Her breast cancer was diagnosed on 11/16/2009. Was invasive ductal carcinoma. She underwent modified radical mastectomy on 12/21/2009. Invasive ductal carcinoma measured 2.8 cm, margins were not involved, and there was metastatic carcinoma in three of 20 lymph nodes. It was triple negative with a Ki-67 of 95%.Subsequent chemo by Dr Lamonte Sakai and radiation by Dr Tammi Klippel    Past Surgical History  Procedure Laterality Date  . Mastectomy  2011  . Abdominal hysterectomy     No current outpatient prescriptions on file prior to visit.   No current facility-administered medications on file prior to visit.   Allergies  Allergen Reactions  . Latex Rash   History   Social History  . Marital Status: Single    Spouse Name: N/A    Number of Children: N/A  . Years of Education: N/A   Occupational History  . Not on file.   Social History Main Topics  . Smoking status: Current Some Day Smoker -- 0.50 packs/day  . Smokeless tobacco: Never Used     Comment: 4-5 CIGS A DAY  . Alcohol Use: Yes     Comment: SOCIAL  . Drug Use: No  . Sexual Activity: Not on file   Other Topics Concern  . Not on file   Social History Narrative  .  No narrative on file      Review of Systems  All other systems reviewed and are negative.      Objective:   Physical Exam  Vitals reviewed. Constitutional: She appears well-developed and well-nourished. No distress.  HENT:  Right Ear: External ear normal. Decreased hearing is noted.  Left Ear: External ear normal. Decreased hearing is noted.  Nose: Mucosal edema and rhinorrhea present. Right sinus exhibits no maxillary sinus tenderness and no frontal sinus tenderness. Left sinus exhibits no maxillary sinus tenderness and no frontal sinus tenderness.  Mouth/Throat: Oropharynx is clear and moist. No oropharyngeal exudate.  Eyes: Conjunctivae are normal.  Neck: Neck supple.  Cardiovascular: Normal rate, regular rhythm and normal heart sounds.   No murmur heard. Pulmonary/Chest: Effort normal and breath sounds normal. No respiratory distress. She has no wheezes. She has no rales.  Lymphadenopathy:    She has no cervical adenopathy.  Skin: She is not diaphoretic.          Assessment & Plan:  Cerumen impaction, bilateral  URI, acute - Plan: cetirizine-pseudoephedrine (ZYRTEC-D) 5-120 MG per tablet   Patient has bilateral cerumen impactions. Both ears were cleared with lavage/irrigation. The patient tolerated the procedure without complication.  The remainder of her symptoms are consistent with upper respiratory tract infection.  Begin Zyrtec-D 5/120 mg one by mouth twice a day prn congestion.  Patient develops fevers or chills or sinus pain, treated with amoxicillin.

## 2014-05-16 ENCOUNTER — Other Ambulatory Visit: Payer: BC Managed Care – PPO | Admitting: Obstetrics & Gynecology

## 2014-05-24 ENCOUNTER — Other Ambulatory Visit (HOSPITAL_COMMUNITY)
Admission: RE | Admit: 2014-05-24 | Discharge: 2014-05-24 | Disposition: A | Discharge status: Home / Self Care | Payer: BC Managed Care – PPO | Source: Ambulatory Visit | Attending: Obstetrics & Gynecology | Admitting: Obstetrics & Gynecology

## 2014-05-24 ENCOUNTER — Encounter: Payer: Self-pay | Admitting: Obstetrics & Gynecology

## 2014-05-24 ENCOUNTER — Ambulatory Visit (INDEPENDENT_AMBULATORY_CARE_PROVIDER_SITE_OTHER): Payer: BC Managed Care – PPO | Admitting: Obstetrics & Gynecology

## 2014-05-24 VITALS — BP 150/90 | Ht 61.0 in | Wt 137.0 lb

## 2014-05-24 DIAGNOSIS — Z01419 Encounter for gynecological examination (general) (routine) without abnormal findings: Secondary | ICD-10-CM | POA: Insufficient documentation

## 2014-05-24 NOTE — Progress Notes (Signed)
Patient ID: Debbie Woods, female   DOB: May 31, 1961, 53 y.o.   MRN: [id number] Subjective:     Debbie Woods is a 53 y.o. female here for a routine exam.  No LMP recorded. Patient is postmenopausal. No obstetric history on file. Birth Control Method:  na Menstrual Calendar(currently): none  Current complaints: none.   Current acute medical issues:     Recent Gynecologic History No LMP recorded. Patient is postmenopausal. Last Pap: 2014,  normal Last mammogram: 2015,  normal  Past Medical History  Diagnosis Date  . Cancer   . Breast cancer, right, IDC, Stage II, Triple neg 11/16/2009    Her breast cancer was diagnosed on 11/16/2009. Was invasive ductal carcinoma. She underwent modified radical mastectomy on 12/21/2009. Invasive ductal carcinoma measured 2.8 cm, margins were not involved, and there was metastatic carcinoma in three of 20 lymph nodes. It was triple negative with a Ki-67 of 95%.Subsequent chemo by Dr Lamonte Sakai and radiation by Dr Tammi Klippel     Past Surgical History  Procedure Laterality Date  . Mastectomy  2011  . Abdominal hysterectomy      OB History   Grav Para Term Preterm Abortions TAB SAB Ect Mult Living                  History   Social History  . Marital Status: Single    Spouse Name: N/A    Number of Children: N/A  . Years of Education: N/A   Social History Main Topics  . Smoking status: Current Some Day Smoker -- 0.50 packs/day  . Smokeless tobacco: Never Used     Comment: 4-5 CIGS A DAY  . Alcohol Use: Yes     Comment: SOCIAL  . Drug Use: No  . Sexual Activity: None   Other Topics Concern  . None   Social History Narrative  . None    History reviewed. No pertinent family history.  Current outpatient prescriptions:cetirizine-pseudoephedrine (ZYRTEC-D) 5-120 MG per tablet, Take 1 tablet by mouth 2 (two) times daily., Disp: 30 tablet, Rfl: 1  Review of Systems  Review of Systems  Constitutional: Negative for fever, chills, weight loss,  malaise/fatigue and diaphoresis.  HENT: Negative for hearing loss, ear pain, nosebleeds, congestion, sore throat, neck pain, tinnitus and ear discharge.   Eyes: Negative for blurred vision, double vision, photophobia, pain, discharge and redness.  Respiratory: Negative for cough, hemoptysis, sputum production, shortness of breath, wheezing and stridor.   Cardiovascular: Negative for chest pain, palpitations, orthopnea, claudication, leg swelling and PND.  Gastrointestinal: negative for abdominal pain. Negative for heartburn, nausea, vomiting, diarrhea, constipation, blood in stool and melena.  Genitourinary: Negative for dysuria, urgency, frequency, hematuria and flank pain.  Musculoskeletal: Negative for myalgias, back pain, joint pain and falls.  Skin: Negative for itching and rash.  Neurological: Negative for dizziness, tingling, tremors, sensory change, speech change, focal weakness, seizures, loss of consciousness, weakness and headaches.  Endo/Heme/Allergies: Negative for environmental allergies and polydipsia. Does not bruise/bleed easily.  Psychiatric/Behavioral: Negative for depression, suicidal ideas, hallucinations, memory loss and substance abuse. The patient is not nervous/anxious and does not have insomnia.        Objective:  Blood pressure 150/90, height '5\' 1"'  (1.549 m), weight 137 lb (62.143 kg).   Physical Exam  Vitals reviewed. Constitutional: She is oriented to person, place, and time. She appears well-developed and well-nourished.  HENT:  Head: Normocephalic and atraumatic.        Right Ear: External ear normal.  Left Ear: External ear normal.  Nose: Nose normal.  Mouth/Throat: Oropharynx is clear and moist.  Eyes: Conjunctivae and EOM are normal. Pupils are equal, round, and reactive to light. Right eye exhibits no discharge. Left eye exhibits no discharge. No scleral icterus.  Neck: Normal range of motion. Neck supple. No tracheal deviation present. No thyromegaly  present.  Cardiovascular: Normal rate, regular rhythm, normal heart sounds and intact distal pulses.  Exam reveals no gallop and no friction rub.   No murmur heard. Respiratory: Effort normal and breath sounds normal. No respiratory distress. She has no wheezes. She has no rales. She exhibits no tenderness.  GI: Soft. Bowel sounds are normal. She exhibits no distension and no mass. There is no tenderness. There is no rebound and no guarding.  Genitourinary:  Breasts s/p right radical mastectomy left breast normal      Vulva is normal without lesions Vagina is pink moist without discharge Cervix normal in appearance and pap is done Uterus is normal size shape and contour Adnexa is negative with normal sized ovaries   Musculoskeletal: Normal range of motion. She exhibits no edema and no tenderness.  Neurological: She is alert and oriented to person, place, and time. She has normal reflexes. She displays normal reflexes. No cranial nerve deficit. She exhibits normal muscle tone. Coordination normal.  Skin: Skin is warm and dry. No rash noted. No erythema. No pallor.  Psychiatric: She has a normal mood and affect. Her behavior is normal. Judgment and thought content normal.       Assessment:    Healthy female exam.    Plan:    Follow up in: 1 year.

## 2014-05-25 ENCOUNTER — Telehealth: Payer: Self-pay | Admitting: *Deleted

## 2014-05-25 DIAGNOSIS — K649 Unspecified hemorrhoids: Secondary | ICD-10-CM

## 2014-05-25 NOTE — Telephone Encounter (Signed)
Pt requesting referral to Dr. Roseanne Kaufman office.

## 2014-05-27 LAB — CYTOLOGY - PAP

## 2014-06-01 NOTE — Telephone Encounter (Signed)
Pt informed referral for Dr. Coralee North office complete. Pt should hear from their office for an appt.

## 2014-06-01 NOTE — Telephone Encounter (Signed)
Please contact Dr Roark's/Field's office to refer Debbie Woods for evaluation and management for hemorrhoids

## 2014-08-05 HISTORY — PX: BREAST BIOPSY: SHX20

## 2014-08-05 HISTORY — PX: HEMORRHOID BANDING: SHX5850

## 2014-09-06 ENCOUNTER — Telehealth: Payer: Self-pay | Admitting: *Deleted

## 2014-09-06 NOTE — Telephone Encounter (Signed)
Pt informed of WNL Pap from 05/24/2014.

## 2014-09-21 ENCOUNTER — Encounter: Payer: Self-pay | Admitting: Nurse Practitioner

## 2014-09-21 ENCOUNTER — Encounter (INDEPENDENT_AMBULATORY_CARE_PROVIDER_SITE_OTHER): Payer: Self-pay

## 2014-09-21 ENCOUNTER — Ambulatory Visit (INDEPENDENT_AMBULATORY_CARE_PROVIDER_SITE_OTHER): Payer: BLUE CROSS/BLUE SHIELD | Admitting: Nurse Practitioner

## 2014-09-21 VITALS — BP 133/86 | HR 86 | Temp 98.4°F | Ht 61.0 in | Wt 134.4 lb

## 2014-09-21 DIAGNOSIS — K649 Unspecified hemorrhoids: Secondary | ICD-10-CM

## 2014-09-21 NOTE — Progress Notes (Signed)
Primary Care Physician:  Odette Fraction, MD Primary Gastroenterologist:  Dr. Gala Romney  Chief Complaint  Patient presents with  . Hemorrhoids  . consult for banding    HPI:   54 year old female presents on referral from OB/GYN for consideration of hemorrhoid banding. Last colonoscopy in 2010 showed grade I internal hemorrhoids without bleeding. Has had hemorrhoids since her son was born in 25. Currently is having irritation and pain. Has occasional toilet tissue brbpr. Has a bowel movement daily and varies from bristol 3-6 depending on her recent diet. Drinks about 40-60 ounces of water a day. Eats a lot of fruits and vegetables. Does not take any stool softeners. Has hemorrhoid symptoms daily. Discussed with her OB/GYN who recommended GI consult due to has tried other treatments multiple times. Denies any other upper or lower GI symptoms.  Past Medical History  Diagnosis Date  . Cancer   . Breast cancer, right, IDC, Stage II, Triple neg 11/16/2009    Her breast cancer was diagnosed on 11/16/2009. Was invasive ductal carcinoma. She underwent modified radical mastectomy on 12/21/2009. Invasive ductal carcinoma measured 2.8 cm, margins were not involved, and there was metastatic carcinoma in three of 20 lymph nodes. It was triple negative with a Ki-67 of 95%.Subsequent chemo by Dr Lamonte Sakai and radiation by Dr Tammi Klippel     Past Surgical History  Procedure Laterality Date  . Mastectomy  2011  . Abdominal hysterectomy      Current Outpatient Prescriptions  Medication Sig Dispense Refill  . cetirizine-pseudoephedrine (ZYRTEC-D) 5-120 MG per tablet Take 1 tablet by mouth 2 (two) times daily. (Patient not taking: Reported on 09/21/2014) 30 tablet 1   No current facility-administered medications for this visit.    Allergies as of 09/21/2014 - Review Complete 09/21/2014  Allergen Reaction Noted  . Latex Rash 12/21/2010    No family history on file.  History   Social History  . Marital  Status: Single    Spouse Name: N/A  . Number of Children: N/A  . Years of Education: N/A   Occupational History  . Not on file.   Social History Main Topics  . Smoking status: Current Some Day Smoker -- 0.50 packs/day  . Smokeless tobacco: Never Used     Comment: 4-5 CIGS A DAY  . Alcohol Use: Yes     Comment: SOCIAL  . Drug Use: No  . Sexual Activity: Not on file   Other Topics Concern  . Not on file   Social History Narrative    Review of Systems: Gen: Denies any fever, chills, fatigue, weight loss, lack of appetite.  CV: Denies chest pain, heart palpitations, peripheral edema, syncope.  Resp: Denies shortness of breath at rest or with exertion. Denies wheezing or cough.  GI: See HPI. Denies dysphagia or odynophagia. Denies jaundice, hematemesis, fecal incontinence. GU : Denies urinary burning, urinary frequency, urinary hesitancy MS: Denies joint pain, muscle weakness, cramps, or limitation of movement.  Derm: Denies rash, itching, dry skin Psych: Denies depression, anxiety, memory loss, and confusion Heme: Denies bruising, bleeding, and enlarged lymph nodes.  Physical Exam: BP 133/86 mmHg  Pulse 86  Temp(Src) 98.4 F (36.9 C)  Ht '5\' 1"'  (1.549 m)  Wt 134 lb 6.4 oz (60.963 kg)  BMI 25.41 kg/m2 General:   Alert and oriented. Pleasant and cooperative. Well-nourished and well-developed.  Head:  Normocephalic and atraumatic. Eyes:  Without icterus, sclera clear and conjunctiva pink.  Ears:  Normal auditory acuity. Lungs:  Clear to  auscultation bilaterally. No wheezes, rales, or rhonchi. No distress.  Heart:  S1, S2 present without murmurs appreciated.  Abdomen:  +BS, soft, non-tender and non-distended. No HSM noted. No guarding or rebound. No masses appreciated.  Rectal:  Deferred  Msk:  Symmetrical without gross deformities. Normal posture. Extremities:  Without clubbing or edema. Neurologic:  Alert and  oriented x4;  grossly normal neurologically. Skin:  Intact  without significant lesions or rashes. Psych:  Alert and cooperative. Normal mood and affect.     09/21/2014 10:23 AM

## 2014-09-21 NOTE — Patient Instructions (Signed)
1. We will schedule your procedure visit for you. 2. Further recommendations will be given at that time.

## 2014-09-22 DIAGNOSIS — K649 Unspecified hemorrhoids: Secondary | ICD-10-CM | POA: Insufficient documentation

## 2014-09-22 NOTE — Assessment & Plan Note (Signed)
History of persistent hemorrhoids since childbirth in the 1986. Has tried multiple temporary remedies. Is interested in procedure to help eliminate the issue. Grade 1 internal hemorrhoids per colonoscopy in 2010. Has adequate water and fiber intake. Explained the procedure and precautions. Stated that it could take 2 or more visits for repeat banding and recurrence is a possibility. Patient would like to proceed with in office [place] banding given unsatisfactory control of symptoms with other means. Will arrange for banding visit with Dr. Gala Romney.

## 2014-09-22 NOTE — Progress Notes (Signed)
cc'ed to pcp °

## 2014-10-13 ENCOUNTER — Other Ambulatory Visit: Payer: Self-pay

## 2014-10-13 DIAGNOSIS — Z1231 Encounter for screening mammogram for malignant neoplasm of breast: Secondary | ICD-10-CM

## 2014-10-13 DIAGNOSIS — Z9011 Acquired absence of right breast and nipple: Secondary | ICD-10-CM

## 2014-10-25 ENCOUNTER — Encounter: Payer: Self-pay | Admitting: Internal Medicine

## 2014-10-25 ENCOUNTER — Ambulatory Visit (INDEPENDENT_AMBULATORY_CARE_PROVIDER_SITE_OTHER): Payer: BLUE CROSS/BLUE SHIELD | Admitting: Internal Medicine

## 2014-10-25 VITALS — BP 152/89 | HR 74 | Temp 97.3°F | Ht 61.0 in | Wt 136.8 lb

## 2014-10-25 DIAGNOSIS — K648 Other hemorrhoids: Secondary | ICD-10-CM

## 2014-10-25 NOTE — Patient Instructions (Signed)
Avoid straining.  Try benefiber 2 teaspoons twice daily  Limit toilet time to 2-3 minutes  Call with any interim problems  Schedule followup appointment in 6 weeks from now

## 2014-10-25 NOTE — Progress Notes (Signed)
[place] banding procedure note:  The patient presents with symptomatic grade 1- 3 hemorrhoids, unresponsive to maximal medical therapy;  Patient requesting rubber band ligation of her hemorrhoidal disease. All risks, benefits, and alternative forms of therapy were described and informed consent was obtained.  In the left lateral decubitus position, a DRE revealed easily reducible grade 3 hemorrhoids; no other abnormalities.  The decision was made to band the left lateral internal hemorrhoid, and the [place] was used to perform band ligation without complication. Digital anorectal examination was then performed to assure proper positioning of the non--latex band; efelt to be in excellent position. No pinching or pain. The patient was discharged home without pain or other issues. Dietary and behavioral recommendations were given along with follow-up instructions. The patient will return in 6 weeks for followup and possible additional banding as required.  No complications were encountered and the patient tolerated the procedure well.

## 2014-11-14 ENCOUNTER — Ambulatory Visit
Admission: RE | Admit: 2014-11-14 | Discharge: 2014-11-14 | Disposition: A | Discharge status: Home / Self Care | Payer: BLUE CROSS/BLUE SHIELD | Source: Ambulatory Visit

## 2014-11-14 DIAGNOSIS — Z1231 Encounter for screening mammogram for malignant neoplasm of breast: Secondary | ICD-10-CM

## 2014-11-14 DIAGNOSIS — Z9011 Acquired absence of right breast and nipple: Secondary | ICD-10-CM

## 2014-11-15 ENCOUNTER — Other Ambulatory Visit: Payer: Self-pay | Admitting: Obstetrics & Gynecology

## 2014-11-15 DIAGNOSIS — R928 Other abnormal and inconclusive findings on diagnostic imaging of breast: Secondary | ICD-10-CM

## 2014-11-21 ENCOUNTER — Ambulatory Visit
Admission: RE | Admit: 2014-11-21 | Discharge: 2014-11-21 | Disposition: A | Discharge status: Home / Self Care | Payer: BLUE CROSS/BLUE SHIELD | Source: Ambulatory Visit | Attending: Obstetrics & Gynecology | Admitting: Obstetrics & Gynecology

## 2014-11-21 ENCOUNTER — Other Ambulatory Visit: Payer: Self-pay | Admitting: Obstetrics & Gynecology

## 2014-11-21 DIAGNOSIS — R928 Other abnormal and inconclusive findings on diagnostic imaging of breast: Secondary | ICD-10-CM

## 2014-11-23 ENCOUNTER — Ambulatory Visit
Admission: RE | Admit: 2014-11-23 | Discharge: 2014-11-23 | Disposition: A | Discharge status: Home / Self Care | Payer: BLUE CROSS/BLUE SHIELD | Source: Ambulatory Visit | Attending: Obstetrics & Gynecology | Admitting: Obstetrics & Gynecology

## 2014-11-23 ENCOUNTER — Other Ambulatory Visit: Payer: Self-pay | Admitting: Obstetrics & Gynecology

## 2014-11-23 DIAGNOSIS — R928 Other abnormal and inconclusive findings on diagnostic imaging of breast: Secondary | ICD-10-CM

## 2014-12-09 ENCOUNTER — Encounter: Payer: Self-pay | Admitting: Internal Medicine

## 2014-12-09 ENCOUNTER — Ambulatory Visit (INDEPENDENT_AMBULATORY_CARE_PROVIDER_SITE_OTHER): Payer: BLUE CROSS/BLUE SHIELD | Admitting: Internal Medicine

## 2014-12-09 VITALS — BP 132/93 | HR 81 | Temp 98.1°F | Ht 61.0 in | Wt 140.2 lb

## 2014-12-09 DIAGNOSIS — K648 Other hemorrhoids: Secondary | ICD-10-CM | POA: Diagnosis not present

## 2014-12-09 NOTE — Patient Instructions (Signed)
Avoid straining.  Benefiber 2 teaspoons twice daily  Limit toilet time to 2-3 minutes  Call with any interim problems  Schedule followup appointment in 3-4 weeks from now

## 2014-12-09 NOTE — Progress Notes (Signed)
  [place] banding procedure note:  The patient presents with symptomatic grade 3 hemorrhoids - overall, some improvement after having the left lateral hemorrhoid banded previously. Reports less pain and bleeding. Has not yet not been a fiber fortified diet. She desires another band be placed today. All risks, benefits, and alternative forms of therapy were described and informed consent was obtained.  In the left lateral decubitus positionm, a DRE was performed. One small external hemorrhoid tag noted. Otherwise negative..  The decision was made to band the right anterior internal hemorrhoid - the [place] was used to perform non-latex band ligation without complication. Digital anorectal examination was then performed to assure proper positioning of the band and to adjust the banded tissue as required. And family in excellent position. No pressure or pain after deployment.The patient was discharged home without pain or other issues. Dietary and behavioral recommendations were given. The patient will return in 4  weeks for followup and possible additional banding as required.  No complications were encountered and the patient tolerated the procedure well.

## 2014-12-22 ENCOUNTER — Other Ambulatory Visit: Payer: Self-pay | Admitting: Hematology and Oncology

## 2014-12-22 DIAGNOSIS — Z853 Personal history of malignant neoplasm of breast: Secondary | ICD-10-CM

## 2014-12-23 ENCOUNTER — Encounter: Payer: Self-pay | Admitting: Hematology and Oncology

## 2014-12-23 ENCOUNTER — Other Ambulatory Visit (HOSPITAL_BASED_OUTPATIENT_CLINIC_OR_DEPARTMENT_OTHER): Payer: BLUE CROSS/BLUE SHIELD

## 2014-12-23 ENCOUNTER — Ambulatory Visit (HOSPITAL_BASED_OUTPATIENT_CLINIC_OR_DEPARTMENT_OTHER): Payer: BLUE CROSS/BLUE SHIELD | Admitting: Hematology and Oncology

## 2014-12-23 ENCOUNTER — Telehealth: Payer: Self-pay | Admitting: Hematology and Oncology

## 2014-12-23 VITALS — BP 138/85 | HR 68 | Temp 98.5°F | Resp 16 | Ht 61.0 in | Wt 139.7 lb

## 2014-12-23 DIAGNOSIS — Z853 Personal history of malignant neoplasm of breast: Secondary | ICD-10-CM | POA: Diagnosis not present

## 2014-12-23 DIAGNOSIS — Z720 Tobacco use: Secondary | ICD-10-CM

## 2014-12-23 LAB — CBC WITH DIFFERENTIAL/PLATELET
BASO%: 0.2 % (ref 0.0–2.0)
BASOS ABS: 0 10*3/uL (ref 0.0–0.1)
EOS%: 0.9 % (ref 0.0–7.0)
Eosinophils Absolute: 0.1 10*3/uL (ref 0.0–0.5)
HEMATOCRIT: 42.7 % (ref 34.8–46.6)
HGB: 14.5 g/dL (ref 11.6–15.9)
LYMPH#: 1.8 10*3/uL (ref 0.9–3.3)
LYMPH%: 33.4 % (ref 14.0–49.7)
MCH: 33.6 pg (ref 25.1–34.0)
MCHC: 34 g/dL (ref 31.5–36.0)
MCV: 98.8 fL (ref 79.5–101.0)
MONO#: 0.6 10*3/uL (ref 0.1–0.9)
MONO%: 10.6 % (ref 0.0–14.0)
NEUT%: 54.9 % (ref 38.4–76.8)
NEUTROS ABS: 3 10*3/uL (ref 1.5–6.5)
PLATELETS: 195 10*3/uL (ref 145–400)
RBC: 4.32 10*6/uL (ref 3.70–5.45)
RDW: 13.2 % (ref 11.2–14.5)
WBC: 5.5 10*3/uL (ref 3.9–10.3)

## 2014-12-23 LAB — COMPREHENSIVE METABOLIC PANEL (CC13)
ALBUMIN: 3.8 g/dL (ref 3.5–5.0)
ALT: 10 U/L (ref 0–55)
ANION GAP: 10 meq/L (ref 3–11)
AST: 17 U/L (ref 5–34)
Alkaline Phosphatase: 71 U/L (ref 40–150)
BUN: 9.3 mg/dL (ref 7.0–26.0)
CALCIUM: 8.8 mg/dL (ref 8.4–10.4)
CO2: 28 meq/L (ref 22–29)
Chloride: 106 mEq/L (ref 98–109)
Creatinine: 0.6 mg/dL (ref 0.6–1.1)
GLUCOSE: 104 mg/dL (ref 70–140)
POTASSIUM: 4 meq/L (ref 3.5–5.1)
SODIUM: 144 meq/L (ref 136–145)
Total Bilirubin: 0.38 mg/dL (ref 0.20–1.20)
Total Protein: 6.5 g/dL (ref 6.4–8.3)

## 2014-12-23 NOTE — Progress Notes (Signed)
[place] OFFICE PROGRESS NOTE  Patient Care Team: Susy Frizzle, MD as PCP - General (Family Medicine) Daneil Dolin, MD as Consulting Physician (Gastroenterology)  SUMMARY OF ONCOLOGIC HISTORY:  DIAGNOSIS: Right breast cancer status post mastectomy, no evidence of recurrence  SUMMARY OF ONCOLOGIC HISTORY: This is a pleasant 54 year old lady with the diagnosis of locally advanced right breast cancer triple receptor negative. The patient was diagnosed after she saw a mass and palpated a lump on the right breast. She subsequently underwent right breast mastectomy and axillary lymph node dissection. The patient received adjuvant chemotherapy with Adriamycin Cytoxan followed by Taxol and radiation therapy. She completed chemotherapy between July 2011 to October 2011. Final staging was T2, N1, M0, ER/PR HER-2/neu negative Recent screening mammogram in April 2016 revealed anomaly in the left breast. She underwent biopsy which confirmed fibroadenoma. INTERVAL HISTORY: Please see below for problem oriented charting. She denies any recent abnormal breast examination, palpable mass, abnormal breast appearance or nipple changes   REVIEW OF SYSTEMS:   Constitutional: Denies fevers, chills or abnormal weight loss Eyes: Denies blurriness of vision Ears, nose, mouth, throat, and face: Denies mucositis or sore throat Respiratory: Denies cough, dyspnea or wheezes Cardiovascular: Denies palpitation, chest discomfort or lower extremity swelling Gastrointestinal:  Denies nausea, heartburn or change in bowel habits Skin: Denies abnormal skin rashes Lymphatics: Denies new lymphadenopathy or easy bruising Neurological:Denies numbness, tingling or new weaknesses Behavioral/Psych: Mood is stable, no new changes  All other systems were reviewed with the patient and are negative.  I have reviewed the past medical history, past surgical history, social history and family history with the  patient and they are unchanged from previous note.  ALLERGIES:  is allergic to latex.  MEDICATIONS:  No current outpatient prescriptions on file.   No current facility-administered medications for this visit.    PHYSICAL EXAMINATION: ECOG PERFORMANCE STATUS: 0 - Asymptomatic  Filed Vitals:   12/23/14 1507  BP: 138/85  Pulse: 68  Temp: 98.5 F (36.9 C)  Resp: 16   Filed Weights   12/23/14 1507  Weight: 139 lb 11.2 oz (63.368 kg)    GENERAL:alert, no distress and comfortable SKIN: skin color, texture, turgor are normal, no rashes or significant lesions EYES: normal, Conjunctiva are pink and non-injected, sclera clear OROPHARYNX:no exudate, no erythema and lips, buccal mucosa, and tongue normal  NECK: supple, thyroid normal size, non-tender, without nodularity LYMPH:  no palpable lymphadenopathy in the cervical, axillary or inguinal LUNGS: clear to auscultation and percussion with normal breathing effort HEART: regular rate & rhythm and no murmurs and no lower extremity edema ABDOMEN:abdomen soft, non-tender and normal bowel sounds Musculoskeletal:no cyanosis of digits and no clubbing  NEURO: alert & oriented x 3 with fluent speech, no focal motor/sensory deficits  LABORATORY DATA:  I have reviewed the data as listed    Component Value Date/Time   NA 144 12/23/2014 1453   NA 143 02/26/2012 0820   K 4.0 12/23/2014 1453   K 4.2 02/26/2012 0820   CL 105 11/13/2012 1523   CL 106 02/26/2012 0820   CO2 28 12/23/2014 1453   CO2 23 02/26/2012 0820   GLUCOSE 104 12/23/2014 1453   GLUCOSE 99 11/13/2012 1523   GLUCOSE 79 02/26/2012 0820   BUN 9.3 12/23/2014 1453   BUN 9 02/26/2012 0820   CREATININE 0.6 12/23/2014 1453   CREATININE 0.48* 02/26/2012 0820   CALCIUM 8.8 12/23/2014 1453   CALCIUM 9.7 02/26/2012 0820   PROT 6.5 12/23/2014  1453   PROT 6.7 02/26/2012 0820   ALBUMIN 3.8 12/23/2014 1453   ALBUMIN 4.5 02/26/2012 0820   AST 17 12/23/2014 1453   AST 29 02/26/2012  0820   ALT 10 12/23/2014 1453   ALT 20 02/26/2012 0820   ALKPHOS 71 12/23/2014 1453   ALKPHOS 58 02/26/2012 0820   BILITOT 0.38 12/23/2014 1453   BILITOT 0.4 02/26/2012 0820   GFRNONAA >60 12/18/2009 1503   GFRAA  12/18/2009 1503    >60        The eGFR has been calculated using the MDRD equation. This calculation has not been validated in all clinical situations. eGFR's persistently <60 mL/min signify possible Chronic Kidney Disease.    No results found for: SPEP, UPEP  Lab Results  Component Value Date   WBC 5.5 12/23/2014   NEUTROABS 3.0 12/23/2014   HGB 14.5 12/23/2014   HCT 42.7 12/23/2014   MCV 98.8 12/23/2014   PLT 195 12/23/2014      Chemistry      Component Value Date/Time   NA 144 12/23/2014 1453   NA 143 02/26/2012 0820   K 4.0 12/23/2014 1453   K 4.2 02/26/2012 0820   CL 105 11/13/2012 1523   CL 106 02/26/2012 0820   CO2 28 12/23/2014 1453   CO2 23 02/26/2012 0820   BUN 9.3 12/23/2014 1453   BUN 9 02/26/2012 0820   CREATININE 0.6 12/23/2014 1453   CREATININE 0.48* 02/26/2012 0820      Component Value Date/Time   CALCIUM 8.8 12/23/2014 1453   CALCIUM 9.7 02/26/2012 0820   ALKPHOS 71 12/23/2014 1453   ALKPHOS 58 02/26/2012 0820   AST 17 12/23/2014 1453   AST 29 02/26/2012 0820   ALT 10 12/23/2014 1453   ALT 20 02/26/2012 0820   BILITOT 0.38 12/23/2014 1453   BILITOT 0.4 02/26/2012 0820       RADIOGRAPHIC STUDIES: I reviewed recent mammogram and pathology report I have personally reviewed the radiological images as listed and agreed with the findings in the report.   ASSESSMENT & PLAN:  hx: breast cancer right IDC triple negative  Clinically, she has no signs of disease recurrence.  she is now a five-year cancer survivor.  I offered her follow-up with primary care doctor only but she feels more comfortable to return here to be seen on a yearly basis.  I recommend she continue regular mammogram.   Tobacco abuse I spent some time  counseling the patient the importance of tobacco cessation. She is currently not interested to quit now.  I also discussed with her about potential CT screening program for long-term smoker. She would like to go home and think about it.    Orders Placed This Encounter  Procedures  . CBC with Differential/Platelet    Standing Status: Future     Number of Occurrences:      Standing Expiration Date: 01/27/2016  . Comprehensive metabolic panel    Standing Status: Future     Number of Occurrences:      Standing Expiration Date: 01/27/2016   All questions were answered. The patient knows to call the clinic with any problems, questions or concerns. No barriers to learning was detected. I spent 15 minutes counseling the patient face to face. The total time spent in the appointment was 20 minutes and more than 50% was on counseling and review of test results     [facility], [place], MD 12/23/2014 3:32 PM

## 2014-12-23 NOTE — Assessment & Plan Note (Signed)
I spent some time counseling the patient the importance of tobacco cessation. She is currently not interested to quit now.  I also discussed with her about potential CT screening program for long-term smoker. She would like to go home and think about it.

## 2014-12-23 NOTE — Telephone Encounter (Signed)
Gave adn printed appt sched and avs for pt for May 2017 °

## 2014-12-23 NOTE — Assessment & Plan Note (Signed)
Clinically, she has no signs of disease recurrence.  she is now a five-year cancer survivor.  I offered her follow-up with primary care doctor only but she feels more comfortable to return here to be seen on a yearly basis.  I recommend she continue regular mammogram.

## 2015-01-31 ENCOUNTER — Ambulatory Visit (INDEPENDENT_AMBULATORY_CARE_PROVIDER_SITE_OTHER): Payer: BLUE CROSS/BLUE SHIELD | Admitting: Internal Medicine

## 2015-01-31 ENCOUNTER — Encounter: Payer: Self-pay | Admitting: Internal Medicine

## 2015-01-31 VITALS — BP 140/89 | HR 81 | Temp 97.6°F | Ht 61.0 in | Wt 137.6 lb

## 2015-01-31 DIAGNOSIS — K648 Other hemorrhoids: Secondary | ICD-10-CM | POA: Diagnosis not present

## 2015-01-31 NOTE — Progress Notes (Signed)
[place] banding procedure note:  The patient presents with symptomatic grade 3 hemorrhoids;  Status post banding of the left lateral and right anterior column previously. This has been associated with marked improvement in symptoms. She is desiring third baby placed today. Globally,  all of her symptoms have improved;  All risks, benefits, and alternative forms of therapy were described and informed consent was obtained.  In the left lateral decubitus position, a digital rectal exam was performed. The only a small external hemorrhoid tag easily reducible.  The decision was made to band the right posterior internal hemorrhoid;  the [place] was used to perform band ligation without complication. A non latex band was utilized.Digital anorectal examination was then performed to assure proper positioning of the band;  and to adjust the banded tissue as required. Band found to be in excellent position. No pinching or pain.  The patient was discharged home without pain or other issues. Dietary and behavioral recommendations were given The patient will return in 7 months for recheck. No complications were encountered and the patient tolerated the procedure well.

## 2015-01-31 NOTE — Patient Instructions (Signed)
Avoid straining.  Consider trying Benefiber 2 teaspoons  daily  Limit toilet time to 2-3 minutes  Call with any interim problems  Office visit with [place] in January 2017

## 2015-05-04 ENCOUNTER — Other Ambulatory Visit: Payer: Self-pay | Admitting: Obstetrics & Gynecology

## 2015-05-04 DIAGNOSIS — Z853 Personal history of malignant neoplasm of breast: Secondary | ICD-10-CM

## 2015-05-30 ENCOUNTER — Ambulatory Visit (INDEPENDENT_AMBULATORY_CARE_PROVIDER_SITE_OTHER): Payer: BLUE CROSS/BLUE SHIELD | Admitting: Obstetrics & Gynecology

## 2015-05-30 ENCOUNTER — Encounter: Payer: Self-pay | Admitting: Obstetrics & Gynecology

## 2015-05-30 ENCOUNTER — Other Ambulatory Visit (HOSPITAL_COMMUNITY)
Admission: RE | Admit: 2015-05-30 | Discharge: 2015-05-30 | Disposition: A | Discharge status: Home / Self Care | Payer: BLUE CROSS/BLUE SHIELD | Source: Ambulatory Visit | Attending: Obstetrics & Gynecology | Admitting: Obstetrics & Gynecology

## 2015-05-30 VITALS — BP 140/80 | HR 74 | Ht 61.0 in | Wt 134.0 lb

## 2015-05-30 DIAGNOSIS — Z01419 Encounter for gynecological examination (general) (routine) without abnormal findings: Secondary | ICD-10-CM | POA: Diagnosis not present

## 2015-05-30 NOTE — Progress Notes (Signed)
Patient ID: Debbie Woods, female   DOB: 1960-11-27, 54 y.o.   MRN: [id number] Subjective:     Debbie Woods is a 54 y.o. female here for a routine exam.  No LMP recorded (exact date). Patient has had an ablation. No obstetric history on file. Birth Control Method:  ablation Menstrual Calendar(currently): none  Current complaints: none.   Current acute medical issues:  See below   Recent Gynecologic History No LMP recorded (exact date). Patient has had an ablation. Last Pap: 2015 ,  normal Last mammogram: next week,    Past Medical History  Diagnosis Date  . Cancer ([place])   . Breast cancer, right, IDC, Stage II, Triple neg 11/16/2009    Her breast cancer was diagnosed on 11/16/2009. Was invasive ductal carcinoma. She underwent modified radical mastectomy on 12/21/2009. Invasive ductal carcinoma measured 2.8 cm, margins were not involved, and there was metastatic carcinoma in three of 20 lymph nodes. It was triple negative with a Ki-67 of 95%.Subsequent chemo by Dr Lamonte Sakai and radiation by Dr Tammi Klippel   . Hemorrhoids     Past Surgical History  Procedure Laterality Date  . Mastectomy  2011  . Abdominal hysterectomy    . Colonoscopy  2010    Dr.Brodie- internal hemorrhoids o/w normal exam.  . Hemorrhoid banding  2016    Dr.Rourk    OB History    No data available      Social History   Social History  . Marital Status: Single    Spouse Name: N/A  . Number of Children: N/A  . Years of Education: N/A   Social History Main Topics  . Smoking status: Current Some Day Smoker -- 0.50 packs/day  . Smokeless tobacco: Never Used     Comment: 3 cigs daily  . Alcohol Use: 0.0 oz/week    0 Standard drinks or equivalent per week     Comment: SOCIAL  . Drug Use: No  . Sexual Activity: Not Asked   Other Topics Concern  . None   Social History Narrative    History reviewed. No pertinent family history.  No current outpatient prescriptions on file.  Review of Systems  Review of  Systems  Constitutional: Negative for fever, chills, weight loss, malaise/fatigue and diaphoresis.  HENT: Negative for hearing loss, ear pain, nosebleeds, congestion, sore throat, neck pain, tinnitus and ear discharge.   Eyes: Negative for blurred vision, double vision, photophobia, pain, discharge and redness.  Respiratory: Negative for cough, hemoptysis, sputum production, shortness of breath, wheezing and stridor.   Cardiovascular: Negative for chest pain, palpitations, orthopnea, claudication, leg swelling and PND.  Gastrointestinal: negative for abdominal pain. Negative for heartburn, nausea, vomiting, diarrhea, constipation, blood in stool and melena.  Genitourinary: Negative for dysuria, urgency, frequency, hematuria and flank pain.  Musculoskeletal: Negative for myalgias, back pain, joint pain and falls.  Skin: Negative for itching and rash.  Neurological: Negative for dizziness, tingling, tremors, sensory change, speech change, focal weakness, seizures, loss of consciousness, weakness and headaches.  Endo/Heme/Allergies: Negative for environmental allergies and polydipsia. Does not bruise/bleed easily.  Psychiatric/Behavioral: Negative for depression, suicidal ideas, hallucinations, memory loss and substance abuse. The patient is not nervous/anxious and does not have insomnia.        Objective:  Blood pressure 140/80, pulse 74, height _0  (1.549 m), weight 134 lb (60.782 kg).   Physical Exam  Vitals reviewed. Constitutional: She is oriented to person, place, and time. She appears well-developed and well-nourished.  HENT:  Head:  Normocephalic and atraumatic.        Right Ear: External ear normal.  Left Ear: External ear normal.  Nose: Nose normal.  Mouth/Throat: Oropharynx is clear and moist.  Eyes: Conjunctivae and EOM are normal. Pupils are equal, round, and reactive to light. Right eye exhibits no discharge. Left eye exhibits no discharge. No scleral icterus.  Neck: Normal  range of motion. Neck supple. No tracheal deviation present. No thyromegaly present.  Cardiovascular: Normal rate, regular rhythm, normal heart sounds and intact distal pulses.  Exam reveals no gallop and no friction rub.   No murmur heard. Respiratory: Effort normal and breath sounds normal. No respiratory distress. She has no wheezes. She has no rales. She exhibits no tenderness.  GI: Soft. Bowel sounds are normal. She exhibits no distension and no mass. There is no tenderness. There is no rebound and no guarding.  Genitourinary:  Breasts no masses skin changes or nipple changes bilaterally      Vulva is normal without lesions Vagina is pink moist without discharge Cervix normal in appearance and pap is done Uterus is normal size shape and contour Adnexa is negative with normal sized ovaries   Musculoskeletal: Normal range of motion. She exhibits no edema and no tenderness.  Neurological: She is alert and oriented to person, place, and time. She has normal reflexes. She displays normal reflexes. No cranial nerve deficit. She exhibits normal muscle tone. Coordination normal.  Skin: Skin is warm and dry. No rash noted. No erythema. No pallor.  Psychiatric: She has a normal mood and affect. Her behavior is normal. Judgment and thought content normal.       Assessment:    Healthy female exam.    Plan:    Mammogram ordered.   Follow up 1 year

## 2015-06-01 LAB — CYTOLOGY - PAP

## 2015-06-05 ENCOUNTER — Ambulatory Visit: Payer: BLUE CROSS/BLUE SHIELD

## 2015-06-15 ENCOUNTER — Ambulatory Visit
Admission: RE | Admit: 2015-06-15 | Discharge: 2015-06-15 | Disposition: A | Discharge status: Home / Self Care | Payer: BLUE CROSS/BLUE SHIELD | Source: Ambulatory Visit | Attending: Obstetrics & Gynecology | Admitting: Obstetrics & Gynecology

## 2015-06-15 DIAGNOSIS — Z853 Personal history of malignant neoplasm of breast: Secondary | ICD-10-CM

## 2015-08-17 ENCOUNTER — Ambulatory Visit: Payer: BLUE CROSS/BLUE SHIELD | Admitting: Gastroenterology

## 2015-12-22 ENCOUNTER — Encounter: Payer: Self-pay | Admitting: Hematology and Oncology

## 2015-12-22 ENCOUNTER — Telehealth: Payer: Self-pay | Admitting: Hematology and Oncology

## 2015-12-22 ENCOUNTER — Ambulatory Visit (HOSPITAL_BASED_OUTPATIENT_CLINIC_OR_DEPARTMENT_OTHER): Payer: BLUE CROSS/BLUE SHIELD | Admitting: Hematology and Oncology

## 2015-12-22 ENCOUNTER — Other Ambulatory Visit (HOSPITAL_BASED_OUTPATIENT_CLINIC_OR_DEPARTMENT_OTHER): Payer: BLUE CROSS/BLUE SHIELD

## 2015-12-22 VITALS — BP 148/83 | HR 63 | Temp 99.2°F | Resp 20 | Ht 61.0 in | Wt 130.2 lb

## 2015-12-22 DIAGNOSIS — Z720 Tobacco use: Secondary | ICD-10-CM | POA: Diagnosis not present

## 2015-12-22 DIAGNOSIS — Z853 Personal history of malignant neoplasm of breast: Secondary | ICD-10-CM

## 2015-12-22 LAB — COMPREHENSIVE METABOLIC PANEL
ALK PHOS: 68 U/L (ref 40–150)
ALT: 10 U/L (ref 0–55)
ANION GAP: 8 meq/L (ref 3–11)
AST: 17 U/L (ref 5–34)
Albumin: 4.1 g/dL (ref 3.5–5.0)
BILIRUBIN TOTAL: 0.3 mg/dL (ref 0.20–1.20)
BUN: 10.4 mg/dL (ref 7.0–26.0)
CO2: 27 meq/L (ref 22–29)
Calcium: 9.8 mg/dL (ref 8.4–10.4)
Chloride: 106 mEq/L (ref 98–109)
Creatinine: 0.6 mg/dL (ref 0.6–1.1)
Glucose: 90 mg/dl (ref 70–140)
Potassium: 4 mEq/L (ref 3.5–5.1)
Sodium: 142 mEq/L (ref 136–145)
TOTAL PROTEIN: 6.8 g/dL (ref 6.4–8.3)

## 2015-12-22 LAB — CBC WITH DIFFERENTIAL/PLATELET
BASO%: 0.6 % (ref 0.0–2.0)
Basophils Absolute: 0 10*3/uL (ref 0.0–0.1)
EOS ABS: 0 10*3/uL (ref 0.0–0.5)
EOS%: 0.6 % (ref 0.0–7.0)
HCT: 43.7 % (ref 34.8–46.6)
HGB: 14.7 g/dL (ref 11.6–15.9)
LYMPH%: 37.4 % (ref 14.0–49.7)
MCH: 33.5 pg (ref 25.1–34.0)
MCHC: 33.6 g/dL (ref 31.5–36.0)
MCV: 99.8 fL (ref 79.5–101.0)
MONO#: 0.6 10*3/uL (ref 0.1–0.9)
MONO%: 9.1 % (ref 0.0–14.0)
NEUT%: 52.3 % (ref 38.4–76.8)
NEUTROS ABS: 3.4 10*3/uL (ref 1.5–6.5)
PLATELETS: 217 10*3/uL (ref 145–400)
RBC: 4.38 10*6/uL (ref 3.70–5.45)
RDW: 13.3 % (ref 11.2–14.5)
WBC: 6.5 10*3/uL (ref 3.9–10.3)
lymph#: 2.4 10*3/uL (ref 0.9–3.3)

## 2015-12-22 NOTE — Assessment & Plan Note (Signed)
Clinically, she has no signs of disease recurrence. She is now greater than five-year cancer survivor. I recommend she continue regular mammogram. Previously, we had discussed genetic testing and counseling but she declined. I recommend breast cancer survivorship clinic

## 2015-12-22 NOTE — Assessment & Plan Note (Signed)
I spent some time counseling the patient the importance of tobacco cessation. She is attempting to quit herself.

## 2015-12-22 NOTE — Telephone Encounter (Signed)
Gave pt apt & avs °

## 2015-12-22 NOTE — Progress Notes (Signed)
[place] OFFICE PROGRESS NOTE  Patient Care Team: Susy Frizzle, MD as PCP - General (Family Medicine) Daneil Dolin, MD as Consulting Physician (Gastroenterology)  SUMMARY OF ONCOLOGIC HISTORY:  DIAGNOSIS: Right breast cancer status post mastectomy, no evidence of recurrence  SUMMARY OF ONCOLOGIC HISTORY: This is a pleasant lady with the diagnosis of locally advanced right breast cancer triple receptor negative. The patient was diagnosed after she saw a mass and palpated a lump on the right breast. She subsequently underwent right breast mastectomy and axillary lymph node dissection. The patient received adjuvant chemotherapy with Adriamycin Cytoxan followed by Taxol and radiation therapy. She completed chemotherapy between July 2011 to October 2011. Final staging was T2, N1, M0, ER/PR HER-2/neu negative Recent screening mammogram in April 2016 revealed anomaly in the left breast. She underwent biopsy which confirmed fibroadenoma. We discussed genetic counseling and she decline  INTERVAL HISTORY: Please see below for problem oriented charting. She denies any recent abnormal breast examination, palpable mass, abnormal breast appearance or nipple changes She continues to smoke but is attempting to quit  REVIEW OF SYSTEMS:   Constitutional: Denies fevers, chills or abnormal weight loss Eyes: Denies blurriness of vision Ears, nose, mouth, throat, and face: Denies mucositis or sore throat Respiratory: Denies cough, dyspnea or wheezes Cardiovascular: Denies palpitation, chest discomfort or lower extremity swelling Gastrointestinal:  Denies nausea, heartburn or change in bowel habits Skin: Denies abnormal skin rashes Lymphatics: Denies new lymphadenopathy or easy bruising Neurological:Denies numbness, tingling or new weaknesses Behavioral/Psych: Mood is stable, no new changes  All other systems were reviewed with the patient and are negative.  I have reviewed the  past medical history, past surgical history, social history and family history with the patient and they are unchanged from previous note.  ALLERGIES:  is allergic to latex.  MEDICATIONS:  No current outpatient prescriptions on file.   No current facility-administered medications for this visit.    PHYSICAL EXAMINATION: ECOG PERFORMANCE STATUS: 0 - Asymptomatic  Filed Vitals:   12/22/15 1445  BP: 148/83  Pulse: 63  Temp: 99.2 F (37.3 C)  Resp: 20   Filed Weights   12/22/15 1445  Weight: 130 lb 3.2 oz (59.058 kg)    GENERAL:alert, no distress and comfortable SKIN: skin color, texture, turgor are normal, no rashes or significant lesions EYES: normal, Conjunctiva are pink and non-injected, sclera clear OROPHARYNX:no exudate, no erythema and lips, buccal mucosa, and tongue normal  NECK: supple, thyroid normal size, non-tender, without nodularity LYMPH:  no palpable lymphadenopathy in the cervical, axillary or inguinal LUNGS: clear to auscultation and percussion with normal breathing effort HEART: regular rate & rhythm and no murmurs and no lower extremity edema ABDOMEN:abdomen soft, non-tender and normal bowel sounds Musculoskeletal:no cyanosis of digits and no clubbing  NEURO: alert & oriented x 3 with fluent speech, no focal motor/sensory deficits Well-healed mastectomy scar on the right breast without any other abnormalities. Normal breast exam on the left LABORATORY DATA:  I have reviewed the data as listed    Component Value Date/Time   NA 142 12/22/2015 1436   NA 143 02/26/2012 0820   K 4.0 12/22/2015 1436   K 4.2 02/26/2012 0820   CL 105 11/13/2012 1523   CL 106 02/26/2012 0820   CO2 27 12/22/2015 1436   CO2 23 02/26/2012 0820   GLUCOSE 90 12/22/2015 1436   GLUCOSE 99 11/13/2012 1523   GLUCOSE 79 02/26/2012 0820   BUN 10.4 12/22/2015 1436   BUN 9  02/26/2012 0820   CREATININE 0.6 12/22/2015 1436   CREATININE 0.48* 02/26/2012 0820   CALCIUM 9.8 12/22/2015  1436   CALCIUM 9.7 02/26/2012 0820   PROT 6.8 12/22/2015 1436   PROT 6.7 02/26/2012 0820   ALBUMIN 4.1 12/22/2015 1436   ALBUMIN 4.5 02/26/2012 0820   AST 17 12/22/2015 1436   AST 29 02/26/2012 0820   ALT 10 12/22/2015 1436   ALT 20 02/26/2012 0820   ALKPHOS 68 12/22/2015 1436   ALKPHOS 58 02/26/2012 0820   BILITOT 0.30 12/22/2015 1436   BILITOT 0.4 02/26/2012 0820   GFRNONAA >60 12/18/2009 1503   GFRAA  12/18/2009 1503    >60        The eGFR has been calculated using the MDRD equation. This calculation has not been validated in all clinical situations. eGFR's persistently <60 mL/min signify possible Chronic Kidney Disease.    No results found for: SPEP, UPEP  Lab Results  Component Value Date   WBC 6.5 12/22/2015   NEUTROABS 3.4 12/22/2015   HGB 14.7 12/22/2015   HCT 43.7 12/22/2015   MCV 99.8 12/22/2015   PLT 217 12/22/2015      Chemistry      Component Value Date/Time   NA 142 12/22/2015 1436   NA 143 02/26/2012 0820   K 4.0 12/22/2015 1436   K 4.2 02/26/2012 0820   CL 105 11/13/2012 1523   CL 106 02/26/2012 0820   CO2 27 12/22/2015 1436   CO2 23 02/26/2012 0820   BUN 10.4 12/22/2015 1436   BUN 9 02/26/2012 0820   CREATININE 0.6 12/22/2015 1436   CREATININE 0.48* 02/26/2012 0820      Component Value Date/Time   CALCIUM 9.8 12/22/2015 1436   CALCIUM 9.7 02/26/2012 0820   ALKPHOS 68 12/22/2015 1436   ALKPHOS 58 02/26/2012 0820   AST 17 12/22/2015 1436   AST 29 02/26/2012 0820   ALT 10 12/22/2015 1436   ALT 20 02/26/2012 0820   BILITOT 0.30 12/22/2015 1436   BILITOT 0.4 02/26/2012 0820       ASSESSMENT & PLAN:  hx: breast cancer right IDC triple negative Clinically, she has no signs of disease recurrence. She is now greater than five-year cancer survivor. I recommend she continue regular mammogram. Previously, we had discussed genetic testing and counseling but she declined. I recommend breast cancer survivorship clinic   Tobacco abuse I  spent some time counseling the patient the importance of tobacco cessation. She is attempting to quit herself.   Orders Placed This Encounter  Procedures  . Amb Referral to Survivorship Long term    Referral Priority:  Routine    Referral Type:  Consultation    Referred to Provider:  Sylvan Cheese, NP    Number of Visits Requested:  1   All questions were answered. The patient knows to call the clinic with any problems, questions or concerns. No barriers to learning was detected. I spent 15 minutes counseling the patient face to face. The total time spent in the appointment was 20 minutes and more than 50% was on counseling and review of test results     [facility], [place], MD 12/22/2015 4:49 PM

## 2016-03-28 DIAGNOSIS — C50911 Malignant neoplasm of unspecified site of right female breast: Secondary | ICD-10-CM | POA: Diagnosis not present

## 2016-05-14 ENCOUNTER — Other Ambulatory Visit: Payer: Self-pay | Admitting: Obstetrics & Gynecology

## 2016-05-14 DIAGNOSIS — Z1231 Encounter for screening mammogram for malignant neoplasm of breast: Secondary | ICD-10-CM

## 2016-05-27 ENCOUNTER — Ambulatory Visit: Payer: BLUE CROSS/BLUE SHIELD

## 2016-06-03 ENCOUNTER — Encounter: Payer: Self-pay | Admitting: Obstetrics & Gynecology

## 2016-06-03 ENCOUNTER — Ambulatory Visit (INDEPENDENT_AMBULATORY_CARE_PROVIDER_SITE_OTHER): Payer: BLUE CROSS/BLUE SHIELD | Admitting: Obstetrics & Gynecology

## 2016-06-03 ENCOUNTER — Other Ambulatory Visit (HOSPITAL_COMMUNITY)
Admission: RE | Admit: 2016-06-03 | Discharge: 2016-06-03 | Disposition: A | Discharge status: Home / Self Care | Payer: BLUE CROSS/BLUE SHIELD | Source: Ambulatory Visit | Attending: Obstetrics & Gynecology | Admitting: Obstetrics & Gynecology

## 2016-06-03 VITALS — BP 140/100 | HR 72 | Ht 60.0 in | Wt 138.0 lb

## 2016-06-03 DIAGNOSIS — Z01419 Encounter for gynecological examination (general) (routine) without abnormal findings: Secondary | ICD-10-CM | POA: Insufficient documentation

## 2016-06-03 DIAGNOSIS — Z1151 Encounter for screening for human papillomavirus (HPV): Secondary | ICD-10-CM | POA: Diagnosis not present

## 2016-06-03 DIAGNOSIS — Z1211 Encounter for screening for malignant neoplasm of colon: Secondary | ICD-10-CM

## 2016-06-03 DIAGNOSIS — Z1212 Encounter for screening for malignant neoplasm of rectum: Secondary | ICD-10-CM

## 2016-06-03 NOTE — Progress Notes (Signed)
Subjective:     Debbie Woods is a 55 y.o. female here for a routine exam.  No LMP recorded. Patient has had an ablation. No obstetric history on file. Birth Control Method:  menopausal Menstrual Calendar(currently): amenorrheic  Current complaints: none.   Current acute medical issues:  none   Recent Gynecologic History No LMP recorded. Patient has had an ablation. Last Pap: 2016,  normal Last mammogram: 2017,  normal  Past Medical History:  Diagnosis Date  . Breast cancer, right, IDC, Stage II, Triple neg 11/16/2009   Her breast cancer was diagnosed on 11/16/2009. Was invasive ductal carcinoma. She underwent modified radical mastectomy on 12/21/2009. Invasive ductal carcinoma measured 2.8 cm, margins were not involved, and there was metastatic carcinoma in three of 20 lymph nodes. It was triple negative with a Ki-67 of 95%.Subsequent chemo by Dr Lamonte Sakai and radiation by Dr Tammi Klippel   . Cancer ([place])   . Hemorrhoids     Past Surgical History:  Procedure Laterality Date  . ABDOMINAL HYSTERECTOMY    . COLONOSCOPY  2010   Dr.Brodie- internal hemorrhoids o/w normal exam.  . HEMORRHOID BANDING  2016   Dr.Rourk  . MASTECTOMY  2011    OB History    No data available      Social History   Social History  . Marital status: Single    Spouse name: N/A  . Number of children: N/A  . Years of education: N/A   Social History Main Topics  . Smoking status: Current Some Day Smoker    Packs/day: 0.50  . Smokeless tobacco: Never Used     Comment: 3 cigs daily  . Alcohol use 0.0 oz/week     Comment: SOCIAL  . Drug use: No  . Sexual activity: Not Asked     Comment: 1 son   Other Topics Concern  . None   Social History Narrative  . None    History reviewed. No pertinent family history.  No current outpatient prescriptions on file.  Review of Systems  Review of Systems  Constitutional: Negative for fever, chills, weight loss, malaise/fatigue and diaphoresis.  HENT: Negative  for hearing loss, ear pain, nosebleeds, congestion, sore throat, neck pain, tinnitus and ear discharge.   Eyes: Negative for blurred vision, double vision, photophobia, pain, discharge and redness.  Respiratory: Negative for cough, hemoptysis, sputum production, shortness of breath, wheezing and stridor.   Cardiovascular: Negative for chest pain, palpitations, orthopnea, claudication, leg swelling and PND.  Gastrointestinal: negative for abdominal pain. Negative for heartburn, nausea, vomiting, diarrhea, constipation, blood in stool and melena.  Genitourinary: Negative for dysuria, urgency, frequency, hematuria and flank pain.  Musculoskeletal: Negative for myalgias, back pain, joint pain and falls.  Skin: Negative for itching and rash.  Neurological: Negative for dizziness, tingling, tremors, sensory change, speech change, focal weakness, seizures, loss of consciousness, weakness and headaches.  Endo/Heme/Allergies: Negative for environmental allergies and polydipsia. Does not bruise/bleed easily.  Psychiatric/Behavioral: Negative for depression, suicidal ideas, hallucinations, memory loss and substance abuse. The patient is not nervous/anxious and does not have insomnia.        Objective:  Blood pressure (!) 140/100, pulse 72, height 5' (1.524 m), weight 138 lb (62.6 kg).   Physical Exam  Vitals reviewed. Constitutional: She is oriented to person, place, and time. She appears well-developed and well-nourished.  HENT:  Head: Normocephalic and atraumatic.        Right Ear: External ear normal.  Left Ear: External ear normal.  Nose: Nose  normal.  Mouth/Throat: Oropharynx is clear and moist.  Eyes: Conjunctivae and EOM are normal. Pupils are equal, round, and reactive to light. Right eye exhibits no discharge. Left eye exhibits no discharge. No scleral icterus.  Neck: Normal range of motion. Neck supple. No tracheal deviation present. No thyromegaly present.  Cardiovascular: Normal rate,  regular rhythm, normal heart sounds and intact distal pulses.  Exam reveals no gallop and no friction rub.   No murmur heard. Respiratory: Effort normal and breath sounds normal. No respiratory distress. She has no wheezes. She has no rales. She exhibits no tenderness.  GI: Soft. Bowel sounds are normal. She exhibits no distension and no mass. There is no tenderness. There is no rebound and no guarding.  Genitourinary:  Breasts no masses skin changes or nipple changes bilaterally      Vulva is normal without lesions Vagina is pink moist without discharge Cervix normal in appearance and pap is done Uterus is normal size shape and contour Adnexa is negative with normal sized ovaries  {Rectal    hemoccult negative, normal tone, no masses  Musculoskeletal: Normal range of motion. She exhibits no edema and no tenderness.  Neurological: She is alert and oriented to person, place, and time. She has normal reflexes. She displays normal reflexes. No cranial nerve deficit. She exhibits normal muscle tone. Coordination normal.  Skin: Skin is warm and dry. No rash noted. No erythema. No pallor.  Psychiatric: She has a normal mood and affect. Her behavior is normal. Judgment and thought content normal.       Medications Ordered at today's visit: No orders of the defined types were placed in this encounter.   Other orders placed at today's visit: No orders of the defined types were placed in this encounter.     Assessment:    Healthy female exam.    Plan:    Follow up in: 1 year.     Return in about 1 year (around 06/03/2017) for yearly, with Dr Elonda Husky.

## 2016-06-06 LAB — CYTOLOGY - PAP
ADEQUACY: ABSENT
Diagnosis: NEGATIVE
HPV: NOT DETECTED

## 2016-06-25 ENCOUNTER — Ambulatory Visit
Admission: RE | Admit: 2016-06-25 | Discharge: 2016-06-25 | Disposition: A | Discharge status: Home / Self Care | Payer: BLUE CROSS/BLUE SHIELD | Source: Ambulatory Visit | Attending: Obstetrics & Gynecology | Admitting: Obstetrics & Gynecology

## 2016-06-25 DIAGNOSIS — Z1231 Encounter for screening mammogram for malignant neoplasm of breast: Secondary | ICD-10-CM | POA: Diagnosis not present

## 2016-10-15 ENCOUNTER — Encounter: Payer: Self-pay | Admitting: Family Medicine

## 2016-10-15 ENCOUNTER — Ambulatory Visit (INDEPENDENT_AMBULATORY_CARE_PROVIDER_SITE_OTHER): Payer: BLUE CROSS/BLUE SHIELD | Admitting: Family Medicine

## 2016-10-15 ENCOUNTER — Ambulatory Visit
Admission: RE | Admit: 2016-10-15 | Discharge: 2016-10-15 | Disposition: A | Discharge status: Home / Self Care | Payer: BLUE CROSS/BLUE SHIELD | Source: Ambulatory Visit | Attending: Family Medicine | Admitting: Family Medicine

## 2016-10-15 VITALS — BP 160/94 | HR 80 | Temp 98.3°F | Resp 18 | Ht 60.0 in | Wt 141.0 lb

## 2016-10-15 DIAGNOSIS — M47816 Spondylosis without myelopathy or radiculopathy, lumbar region: Secondary | ICD-10-CM | POA: Diagnosis not present

## 2016-10-15 DIAGNOSIS — M545 Low back pain, unspecified: Secondary | ICD-10-CM

## 2016-10-15 LAB — URINALYSIS, ROUTINE W REFLEX MICROSCOPIC
Bilirubin Urine: NEGATIVE
Glucose, UA: NEGATIVE
HGB URINE DIPSTICK: NEGATIVE
Ketones, ur: NEGATIVE
LEUKOCYTES UA: NEGATIVE
NITRITE: NEGATIVE
PROTEIN: NEGATIVE
Specific Gravity, Urine: 1.015 (ref 1.001–1.035)
pH: 6.5 (ref 5.0–8.0)

## 2016-10-15 MED ORDER — PREDNISONE 20 MG PO TABS: ORAL_TABLET | ORAL | 0 refills | Status: DC

## 2016-10-15 NOTE — Progress Notes (Signed)
Subjective:    Patient ID: Debbie Woods, female    DOB: 1960-11-16, 57 y.o.   MRN: [id number]  HPI Patient presents with low back pain for 2 weeks. Pain is located proximally around the level of L3. It radiates in a bandlike fashion from the center of her back around to her lower abdomen on both sides. The pain is deep and intense. It is exacerbated by position changes, standing, walking, bending forward. The pain worsens with flexion beyond 45. She denies any sciatica. She denies any bowel or bladder incontinence. She denies any numbness or tingling in her legs that is new other she does have some chronic neuropathy. She denies any dysuria, hematuria, frequency, urgency. Urinalysis today is completely normal. She denies any constipation, melena, hematochezia Past Medical History:  Diagnosis Date  . Breast cancer, right, IDC, Stage II, Triple neg 11/16/2009   Her breast cancer was diagnosed on 11/16/2009. Was invasive ductal carcinoma. She underwent modified radical mastectomy on 12/21/2009. Invasive ductal carcinoma measured 2.8 cm, margins were not involved, and there was metastatic carcinoma in three of 20 lymph nodes. It was triple negative with a Ki-67 of 95%.Subsequent chemo by Dr Lamonte Sakai and radiation by Dr Tammi Klippel   . Cancer ([place])   . Hemorrhoids    Past Surgical History:  Procedure Laterality Date  . ABDOMINAL HYSTERECTOMY    . COLONOSCOPY  2010   Dr.Brodie- internal hemorrhoids o/w normal exam.  . HEMORRHOID BANDING  2016   Dr.Rourk  . MASTECTOMY  2011   No current outpatient prescriptions on file prior to visit.   No current facility-administered medications on file prior to visit.    Allergies  Allergen Reactions  . Latex Rash   Social History   Social History  . Marital status: Single    Spouse name: N/A  . Number of children: N/A  . Years of education: N/A   Occupational History  . Not on file.   Social History Main Topics  . Smoking status: Current Some Day Smoker     Packs/day: 0.50  . Smokeless tobacco: Never Used     Comment: 3 cigs daily  . Alcohol use 0.0 oz/week     Comment: SOCIAL  . Drug use: No  . Sexual activity: Not on file     Comment: 1 son   Other Topics Concern  . Not on file   Social History Narrative  . No narrative on file      Review of Systems  All other systems reviewed and are negative.      Objective:   Physical Exam  Cardiovascular: Normal rate, regular rhythm and normal heart sounds.   No murmur heard. Pulmonary/Chest: Effort normal and breath sounds normal. No respiratory distress. She has no wheezes. She has no rales.  Abdominal: Soft. Bowel sounds are normal. She exhibits no distension. There is no tenderness. There is no rebound and no guarding.  Musculoskeletal:       Lumbar back: She exhibits decreased range of motion, tenderness and bony tenderness. She exhibits no swelling, no edema, no deformity and no spasm.  Neurological:  Reflex Scores:      Bicep reflexes are 2+ on the right side and 2+ on the left side.      Brachioradialis reflexes are 2+ on the right side and 2+ on the left side.      Patellar reflexes are 0 on the right side and 0 on the left side.      Achilles  reflexes are 1+ on the right side and 1+ on the left side. Vitals reviewed.         Assessment & Plan:  Low back pain without sciatica, unspecified back pain laterality, unspecified chronicity - Plan: Urinalysis, Routine w reflex microscopic, DG Lumbar Spine Complete, predniSONE (DELTASONE) 20 MG tablet  I suspect the patient has a herniated disc. Proceed with an x-ray of the lumbar spine to evaluate for possible signs of degenerative disc disease. Meanwhile treat the patient symptomatically with prednisone taper pack. Await results of the x-ray. Further imaging may be necessary if pain persists an x-ray does not provide a diagnosis

## 2016-10-24 ENCOUNTER — Other Ambulatory Visit: Payer: Self-pay | Admitting: Obstetrics & Gynecology

## 2016-10-24 ENCOUNTER — Telehealth: Payer: Self-pay | Admitting: *Deleted

## 2016-10-24 MED ORDER — FLUCONAZOLE 150 MG PO TABS: 150.0000 mg | ORAL_TABLET | Freq: Once | ORAL | 0 refills | Status: AC

## 2016-10-24 NOTE — Telephone Encounter (Signed)
Patient called stating she thinks she has a yeast infection. She has tried Monistat with no relief and is requesting Diflucan. Please advise.

## 2016-12-19 ENCOUNTER — Encounter: Payer: BLUE CROSS/BLUE SHIELD | Admitting: Nurse Practitioner

## 2016-12-24 ENCOUNTER — Encounter: Payer: BLUE CROSS/BLUE SHIELD | Admitting: Adult Health

## 2017-01-11 ENCOUNTER — Emergency Department (HOSPITAL_COMMUNITY)
Admission: EM | Admit: 2017-01-11 | Discharge: 2017-01-12 | Disposition: A | Discharge status: Home / Self Care | Payer: BLUE CROSS/BLUE SHIELD | Attending: Physician Assistant | Admitting: Physician Assistant

## 2017-01-11 ENCOUNTER — Encounter (HOSPITAL_COMMUNITY): Payer: Self-pay | Admitting: Emergency Medicine

## 2017-01-11 DIAGNOSIS — R52 Pain, unspecified: Secondary | ICD-10-CM

## 2017-01-11 DIAGNOSIS — Y939 Activity, unspecified: Secondary | ICD-10-CM | POA: Diagnosis not present

## 2017-01-11 DIAGNOSIS — S82851A Displaced trimalleolar fracture of right lower leg, initial encounter for closed fracture: Secondary | ICD-10-CM | POA: Diagnosis not present

## 2017-01-11 DIAGNOSIS — Y92019 Unspecified place in single-family (private) house as the place of occurrence of the external cause: Secondary | ICD-10-CM | POA: Insufficient documentation

## 2017-01-11 DIAGNOSIS — T148XXA Other injury of unspecified body region, initial encounter: Secondary | ICD-10-CM | POA: Diagnosis not present

## 2017-01-11 DIAGNOSIS — M25571 Pain in right ankle and joints of right foot: Secondary | ICD-10-CM | POA: Diagnosis not present

## 2017-01-11 DIAGNOSIS — Y999 Unspecified external cause status: Secondary | ICD-10-CM | POA: Insufficient documentation

## 2017-01-11 DIAGNOSIS — W010XXA Fall on same level from slipping, tripping and stumbling without subsequent striking against object, initial encounter: Secondary | ICD-10-CM | POA: Diagnosis not present

## 2017-01-11 DIAGNOSIS — F17200 Nicotine dependence, unspecified, uncomplicated: Secondary | ICD-10-CM | POA: Insufficient documentation

## 2017-01-11 NOTE — ED Triage Notes (Signed)
Pt attempting to stop a bug and her foot came out from under her. Hypersensitive to meds. Allergic to latex. 7/10. 123mcg fentanyl.

## 2017-01-11 NOTE — ED Notes (Signed)
Restricted right side (masectomy).

## 2017-01-11 NOTE — ED Provider Notes (Signed)
[facility] DEPT Provider Note   CSN: [id number] Arrival date & time: 01/11/17  2254  By signing my name below, I, Ephriam Jenkins, attest that this documentation has been prepared under the direction and in the presence of Marcianne Ozbun, [place], *. Electronically signed, Ephriam Jenkins, ED Scribe. 01/12/17. 12:19 AM.  History   Chief Complaint Chief Complaint  Patient presents with  . Ankle Pain    HPI HPI Comments: Debbie Woods is a 56 y.o. female, who presents to the Emergency Department s/p an injury that occurred around 2130. Pt was at her house and attempted to reach down and kill a bug when she lost her footing and rolled onto her right ankle. She was unable to ambulate s/p due to pain; and is still currently unable to bear weight on the extremity. She currently has swelling and bruising to the right ankle. Pt given 143mg Fentanyl on arrival here. Pt did not hit her head, no LOC. She denies any pain to the right knee.  The history is provided by the patient. No language interpreter was used.    Past Medical History:  Diagnosis Date  . Breast cancer, right, IDC, Stage II, Triple neg 11/16/2009   Her breast cancer was diagnosed on 11/16/2009. Was invasive ductal carcinoma. She underwent modified radical mastectomy on 12/21/2009. Invasive ductal carcinoma measured 2.8 cm, margins were not involved, and there was metastatic carcinoma in three of 20 lymph nodes. It was triple negative with a Ki-67 of 95%.Subsequent chemo by Dr HLamonte Sakaiand radiation by Dr MTammi Klippel  . Cancer ([place]   . Hemorrhoids     Patient Active Problem List   Diagnosis Date Noted  . Tobacco abuse 12/23/2014  . Hemorrhoids 09/22/2014  . hx: breast cancer right IDC triple negative 01/10/2012    Past Surgical History:  Procedure Laterality Date  . ABDOMINAL HYSTERECTOMY    . COLONOSCOPY  2010   Dr.Brodie- internal hemorrhoids o/w normal exam.  . HEMORRHOID BANDING  2016   Dr.Rourk  . MASTECTOMY  2011    OB  History    No data available       Home Medications    Prior to Admission medications   Medication Sig Start Date End Date Taking? Authorizing Provider  Biotin 1000 MCG tablet Take 1,000 mcg by mouth 3 (three) times daily.    [provider]  oxyCODONE-acetaminophen (PERCOCET/ROXICET) 5-325 MG tablet Take 1 tablet by mouth every 6 (six) hours as needed for severe pain. 01/12/17   Bridney Guadarrama Lyn, MD  predniSONE (DELTASONE) 20 MG tablet 3 tabs poqday 1-2, 2 tabs poqday 3-4, 1 tab poqday 5-6 10/15/16   PSusy Frizzle MD    Family History History reviewed. No pertinent family history.  Social History Social History  Substance Use Topics  . Smoking status: Current Some Day Smoker    Packs/day: 0.50  . Smokeless tobacco: Never Used     Comment: 3 cigs daily  . Alcohol use 0.0 oz/week     Comment: SOCIAL     Allergies   Latex   Review of Systems Review of Systems  Musculoskeletal: Positive for arthralgias (right ankle), gait problem (due to pain) and joint swelling (right ankle).  Skin: Positive for color change (bruising to right ankle).  Neurological: Negative for headaches.     Physical Exam Updated Vital Signs BP 108/70   Pulse 79   Temp 98.1 F (36.7 C) (Oral)   Resp 16   Ht '5\' 1"'  (1.549 m)  Wt 62.6 kg (138 lb)   SpO2 94%   BMI 26.07 kg/m   Physical Exam  Constitutional: She is oriented to person, place, and time. She appears well-developed and well-nourished. No distress.  HENT:  Head: Normocephalic and atraumatic.  Neck: Normal range of motion.  Cardiovascular: Intact distal pulses.   Intact distal pulses on the right.  Pulmonary/Chest: Effort normal.  Musculoskeletal: She exhibits edema and tenderness.  Bruising to the medial malleolus and swelling diffusely to the ankle joint. No pain to right knee. Intact dorsal and plantar flexion.  Neurological: She is alert and oriented to person, place, and time.  Skin: Skin is warm and dry.  She is not diaphoretic.  Psychiatric: She has a normal mood and affect. Judgment normal.  Nursing note and vitals reviewed.    ED Treatments / Results  DIAGNOSTIC STUDIES: Oxygen Saturation is 96% on RA, adequate by my interpretation.  COORDINATION OF CARE: 12:18 AM-Discussed treatment plan with pt at bedside and pt agreed to plan.   Labs (all labs ordered are listed, but only abnormal results are displayed) Labs Reviewed - No data to display  EKG  EKG Interpretation None       Radiology Dg Ankle 2 Views Right  Result Date: 01/12/2017 CLINICAL DATA:  Patient fell this evening with right ankle deformity, pain and swelling. EXAM: RIGHT ANKLE - 2 VIEW COMPARISON:  None. FINDINGS: An acute, closed, trimalleolar fracture subluxation of the right ankle is noted. The talar dome is laterally subluxed relative to the tibial plafond and. A comminuted closed fracture of the distal fibular diaphysis is noted with lateral angulation of the distal fracture fragment. A transverse fracture through the medial malleolus is noted extending into the ankle joint. Widening of the distal tibial-fibular syndesmosis. Coronal intra-articular fracture involving the posterior malleolus. Small plantar and tiny dorsal calcaneal enthesophytes are noted. The subtalar and midfoot articulations are maintained. IMPRESSION: 1. Acute, closed, trimalleolar fracture with lateral subluxation of the talar dome relative to the tibial plafond. 2. Widened distal tibiofibular syndesmosis. 3. Calcaneal enthesophytes are noted. Electronically Signed   By: Ashley Royalty M.D.   On: 01/12/2017 01:31    Procedures Procedures (including critical care time)  Medications Ordered in ED Medications  oxyCODONE-acetaminophen (PERCOCET/ROXICET) 5-325 MG per tablet 1 tablet (not administered)  fentaNYL (SUBLIMAZE) injection 50 mcg (50 mcg Intravenous Given 01/12/17 0024)  fentaNYL (SUBLIMAZE) injection 50 mcg (50 mcg Intravenous Given  01/12/17 0236)     Initial Impression / Assessment and Plan / ED Course  I have reviewed the triage vital signs and the nursing notes.  Pertinent labs & imaging results that were available during my care of the patient were reviewed by me and considered in my medical decision making (see chart for details).     I personally performed the services described in this documentation, which was scribed in my presence. The recorded information has been reviewed and is accurate.    Patient is a 56 year old female presenting with mechanical fall. Patient has bruising and swelling to the right ankle. Suspect fracture. X-ray ordered and pain control given.  Discussed with Dr. Lyla Glassing. We will reduce at bedside. Discussed options of reduction with patient. Ankles mostly reduce already. Patient would like to try this with pain medication, no sedation. Patient handled reduction very well. Splint applied by me personally with [employer].  SPLINT APPLICATION Date/Time: 1:61 AM Authorized by: Gardiner Sleeper Consent: Verbal consent obtained. Risks and benefits: risks, benefits and alternatives were discussed Consent given  by: patient Splint applied by: me along withorthopedic technician Location details: R ankle Splint type: posterior splint and stirruop Supplies used: ortho glass Post-procedure: The splinted body part was neurovascularly unchanged following the procedure. Patient tolerance: Patient tolerated the procedure well with no immediate complications.     Reduction of dislocation Date/Time: 3:15 AM Performed by: [place] by: Gardiner Sleeper Consent: Verbal consent obtained. Risks and benefits: risks, benefits and alternatives were discussed Consent given by: patient Required items: required blood products, implants, devices, and special equipment available Time out: Immediately prior to procedure a "time out" was called to verify the correct patient,  procedure, equipment, support staff and site/side marked as required.  Patient sedated: No  Vitals: Vital signs were monitored during sedation. Patient tolerance: Patient tolerated the procedure well with no immediate complications. Joint:R ankle Reduction technique: traction and rotation    Final Clinical Impressions(s) / ED Diagnoses   Final diagnoses:  Closed trimalleolar fracture of right ankle, initial encounter    New Prescriptions New Prescriptions   OXYCODONE-ACETAMINOPHEN (PERCOCET/ROXICET) 5-325 MG TABLET    Take 1 tablet by mouth every 6 (six) hours as needed for severe pain.      Macarthur Critchley, MD 01/12/17 [phone]

## 2017-01-12 ENCOUNTER — Emergency Department (HOSPITAL_COMMUNITY): Payer: BLUE CROSS/BLUE SHIELD

## 2017-01-12 DIAGNOSIS — S82851A Displaced trimalleolar fracture of right lower leg, initial encounter for closed fracture: Secondary | ICD-10-CM | POA: Diagnosis not present

## 2017-01-12 MED ORDER — FENTANYL CITRATE (PF) 100 MCG/2ML IJ SOLN
50.0000 ug | Freq: Once | INTRAMUSCULAR | Status: AC
  Administered 2017-01-12: 50 ug via INTRAVENOUS
  Filled 2017-01-12: qty 2

## 2017-01-12 MED ORDER — OXYCODONE-ACETAMINOPHEN 5-325 MG PO TABS: 1.0000 | ORAL_TABLET | Freq: Four times a day (QID) | ORAL | 0 refills | Status: DC | PRN

## 2017-01-12 MED ORDER — OXYCODONE-ACETAMINOPHEN 5-325 MG PO TABS
1.0000 | ORAL_TABLET | Freq: Once | ORAL | Status: AC
  Administered 2017-01-12: 1 via ORAL
  Filled 2017-01-12: qty 1

## 2017-01-12 NOTE — ED Notes (Signed)
Pt did not want crutches. Pt has crutches at home.

## 2017-01-12 NOTE — Discharge Instructions (Signed)
Please call Dr. Harlow Ohms office on Monday morning. You should be seen this week.  Please keep your ankle elevated above your head. Please use ice. Please do not bear weight on the ankle.

## 2017-01-12 NOTE — ED Notes (Signed)
Patient transported to X-ray 

## 2017-01-15 ENCOUNTER — Other Ambulatory Visit: Payer: Self-pay | Admitting: Orthopedic Surgery

## 2017-01-15 DIAGNOSIS — S82851A Displaced trimalleolar fracture of right lower leg, initial encounter for closed fracture: Secondary | ICD-10-CM

## 2017-01-17 ENCOUNTER — Ambulatory Visit
Admission: RE | Admit: 2017-01-17 | Discharge: 2017-01-17 | Disposition: A | Discharge status: Home / Self Care | Payer: BLUE CROSS/BLUE SHIELD | Source: Ambulatory Visit | Attending: Orthopedic Surgery | Admitting: Orthopedic Surgery

## 2017-01-17 DIAGNOSIS — S82851A Displaced trimalleolar fracture of right lower leg, initial encounter for closed fracture: Secondary | ICD-10-CM

## 2017-01-17 DIAGNOSIS — S82891A Other fracture of right lower leg, initial encounter for closed fracture: Secondary | ICD-10-CM | POA: Diagnosis not present

## 2017-01-21 DIAGNOSIS — G8918 Other acute postprocedural pain: Secondary | ICD-10-CM | POA: Diagnosis not present

## 2017-01-21 DIAGNOSIS — S82851A Displaced trimalleolar fracture of right lower leg, initial encounter for closed fracture: Secondary | ICD-10-CM | POA: Diagnosis not present

## 2017-02-04 DIAGNOSIS — S82851D Displaced trimalleolar fracture of right lower leg, subsequent encounter for closed fracture with routine healing: Secondary | ICD-10-CM | POA: Diagnosis not present

## 2017-03-05 DIAGNOSIS — S82851D Displaced trimalleolar fracture of right lower leg, subsequent encounter for closed fracture with routine healing: Secondary | ICD-10-CM | POA: Diagnosis not present

## 2017-03-18 DIAGNOSIS — M25571 Pain in right ankle and joints of right foot: Secondary | ICD-10-CM | POA: Diagnosis not present

## 2017-04-04 DIAGNOSIS — S82851D Displaced trimalleolar fracture of right lower leg, subsequent encounter for closed fracture with routine healing: Secondary | ICD-10-CM | POA: Diagnosis not present

## 2017-04-08 DIAGNOSIS — M25571 Pain in right ankle and joints of right foot: Secondary | ICD-10-CM | POA: Diagnosis not present

## 2017-04-15 DIAGNOSIS — M25571 Pain in right ankle and joints of right foot: Secondary | ICD-10-CM | POA: Diagnosis not present

## 2017-04-22 DIAGNOSIS — M25571 Pain in right ankle and joints of right foot: Secondary | ICD-10-CM | POA: Diagnosis not present

## 2017-05-05 DIAGNOSIS — S82851D Displaced trimalleolar fracture of right lower leg, subsequent encounter for closed fracture with routine healing: Secondary | ICD-10-CM | POA: Diagnosis not present

## 2017-06-03 ENCOUNTER — Other Ambulatory Visit: Payer: Self-pay | Admitting: Obstetrics & Gynecology

## 2017-06-03 DIAGNOSIS — Z9011 Acquired absence of right breast and nipple: Secondary | ICD-10-CM

## 2017-06-03 DIAGNOSIS — Z853 Personal history of malignant neoplasm of breast: Secondary | ICD-10-CM

## 2017-06-17 ENCOUNTER — Encounter: Payer: Self-pay | Admitting: Obstetrics & Gynecology

## 2017-06-17 ENCOUNTER — Other Ambulatory Visit (HOSPITAL_COMMUNITY)
Admission: RE | Admit: 2017-06-17 | Discharge: 2017-06-17 | Disposition: A | Discharge status: Home / Self Care | Payer: BLUE CROSS/BLUE SHIELD | Source: Ambulatory Visit | Attending: Obstetrics & Gynecology | Admitting: Obstetrics & Gynecology

## 2017-06-17 ENCOUNTER — Ambulatory Visit (INDEPENDENT_AMBULATORY_CARE_PROVIDER_SITE_OTHER): Payer: BLUE CROSS/BLUE SHIELD | Admitting: Obstetrics & Gynecology

## 2017-06-17 VITALS — BP 132/70 | HR 76 | Ht 60.0 in | Wt 148.0 lb

## 2017-06-17 DIAGNOSIS — Z01419 Encounter for gynecological examination (general) (routine) without abnormal findings: Secondary | ICD-10-CM | POA: Diagnosis not present

## 2017-06-17 DIAGNOSIS — Z853 Personal history of malignant neoplasm of breast: Secondary | ICD-10-CM | POA: Diagnosis not present

## 2017-06-17 NOTE — Progress Notes (Signed)
Subjective:     Debbie Woods is a 56 y.o. female here for a routine exam.  No LMP recorded. Patient has had an ablation. No obstetric history on file. Birth Control Method:  menopausal Menstrual Calendar(currently): amenorrheic   Current complaints: none.   Current acute medical issues:  none   Recent Gynecologic History No LMP recorded. Patient has had an ablation. Last Pap: 2017,  normal Last mammogram: 2017,  normal  Past Medical History:  Diagnosis Date  . Breast cancer, right, IDC, Stage II, Triple neg 11/16/2009   Her breast cancer was diagnosed on 11/16/2009. Was invasive ductal carcinoma. She underwent modified radical mastectomy on 12/21/2009. Invasive ductal carcinoma measured 2.8 cm, margins were not involved, and there was metastatic carcinoma in three of 20 lymph nodes. It was triple negative with a Ki-67 of 95%.Subsequent chemo by Dr Lamonte Sakai and radiation by Dr Tammi Klippel   . Cancer ([place])   . Hemorrhoids     Past Surgical History:  Procedure Laterality Date  . COLONOSCOPY  2010   Dr.Brodie- internal hemorrhoids o/w normal exam.  . ENDOMETRIAL ABLATION  2012  . HEMORRHOID BANDING  2016   Dr.Rourk  . MASTECTOMY  2011    OB History    No data available      Social History   Socioeconomic History  . Marital status: Single    Spouse name: None  . Number of children: None  . Years of education: None  . Highest education level: None  Social Needs  . Financial resource strain: None  . Food insecurity - worry: None  . Food insecurity - inability: None  . Transportation needs - medical: None  . Transportation needs - non-medical: None  Occupational History  . None  Tobacco Use  . Smoking status: Current Some Day Smoker    Packs/day: 0.50  . Smokeless tobacco: Never Used  . Tobacco comment: 3 cigs daily  Substance and Sexual Activity  . Alcohol use: Yes    Alcohol/week: 0.0 oz    Comment: SOCIAL  . Drug use: No  . Sexual activity: None    Comment: 1 son   Other Topics Concern  . None  Social History Narrative  . None    No family history on file.   Current Outpatient Medications:  .  Biotin 1000 MCG tablet, Take 1,000 mcg by mouth 3 (three) times daily., Disp: , Rfl:  .  cholecalciferol (VITAMIN D) 1000 units tablet, Take 1,000 Units daily by mouth., Disp: , Rfl:  .  oxyCODONE-acetaminophen (PERCOCET/ROXICET) 5-325 MG tablet, Take 1 tablet by mouth every 6 (six) hours as needed for severe pain. (Patient not taking: Reported on 06/17/2017), Disp: 15 tablet, Rfl: 0 .  predniSONE (DELTASONE) 20 MG tablet, 3 tabs poqday 1-2, 2 tabs poqday 3-4, 1 tab poqday 5-6 (Patient not taking: Reported on 06/17/2017), Disp: 12 tablet, Rfl: 0  Review of Systems  Review of Systems  Constitutional: Negative for fever, chills, weight loss, malaise/fatigue and diaphoresis.  HENT: Negative for hearing loss, ear pain, nosebleeds, congestion, sore throat, neck pain, tinnitus and ear discharge.   Eyes: Negative for blurred vision, double vision, photophobia, pain, discharge and redness.  Respiratory: Negative for cough, hemoptysis, sputum production, shortness of breath, wheezing and stridor.   Cardiovascular: Negative for chest pain, palpitations, orthopnea, claudication, leg swelling and PND.  Gastrointestinal: negative for abdominal pain. Negative for heartburn, nausea, vomiting, diarrhea, constipation, blood in stool and melena.  Genitourinary: Negative for dysuria, urgency, frequency, hematuria and  flank pain.  Musculoskeletal: Negative for myalgias, back pain, joint pain and falls.  Skin: Negative for itching and rash.  Neurological: Negative for dizziness, tingling, tremors, sensory change, speech change, focal weakness, seizures, loss of consciousness, weakness and headaches.  Endo/Heme/Allergies: Negative for environmental allergies and polydipsia. Does not bruise/bleed easily.  Psychiatric/Behavioral: Negative for depression, suicidal ideas,  hallucinations, memory loss and substance abuse. The patient is not nervous/anxious and does not have insomnia.        Objective:  Blood pressure 132/70, pulse 76, height 5' (1.524 m), weight 148 lb (67.1 kg).   Physical Exam  Vitals reviewed. Constitutional: She is oriented to person, place, and time. She appears well-developed and well-nourished.  HENT:  Head: Normocephalic and atraumatic.        Right Ear: External ear normal.  Left Ear: External ear normal.  Nose: Nose normal.  Mouth/Throat: Oropharynx is clear and moist.  Eyes: Conjunctivae and EOM are normal. Pupils are equal, round bilaterally. Right eye exhibits no discharge. Left eye exhibits no discharge. No scleral icterus.  Neck: Normal range of motion. Neck supple. No tracheal deviation present. No thyromegaly present.  Cardiovascular: Normal rate, regular rhythm, normal heart sounds and intact distal pulses.  Exam reveals no gallop and no friction rub.   No murmur heard. Respiratory: Effort normal and breath sounds normal. No respiratory distress. She has no wheezes. She has no rales. She exhibits no tenderness.  GI: Soft. Bowel sounds are normal. She exhibits no distension and no mass. There is no tenderness. There is no rebound and no guarding.  Genitourinary:  Breasts no masses skin changes or nipple changes on L breast. R breast post surgical removal.       Vulva is normal without lesions Vagina is pink moist without discharge Cervix normal in appearance and pap is done Uterus is normal size shape and contour Adnexa is negative with normal sized ovaries  Musculoskeletal: Normal range of motion. She exhibits no edema and no tenderness.  Neurological: She is alert and oriented to person, place, and time. She has normal reflexes. She displays normal reflexes. No cranial nerve deficit. She exhibits normal muscle tone. Coordination normal.  Skin: Skin is warm and dry. No rash noted. No erythema. No pallor.  Psychiatric:  She has a normal mood and affect. Her behavior is normal. Judgment and thought content normal.       Medications Ordered at today's visit: Meds ordered this encounter  Medications  . cholecalciferol (VITAMIN D) 1000 units tablet    Sig: Take 1,000 Units daily by mouth.    Other orders placed at today's visit: No orders of the defined types were placed in this encounter.     Assessment:    Healthy female exam.    Plan:    Follow up in: 1 year.     Return in about 1 year (around 06/17/2018) for yearly.   Bufford Lope, DO PGY-2, [facility] 06/17/2017 2:56 PM

## 2017-06-17 NOTE — Progress Notes (Signed)
Subjective:     Debbie Woods is a 56 y.o. female here for a routine exam.  No LMP recorded. Patient has had an ablation. No obstetric history on file. Birth Control Method:  Post menopausal, ablation Menstrual Calendar(currently): Reviewed outsideamenorrheic Current complaints: none.   Current acute medical issues:  Hx of breast cancer   Recent Gynecologic History No LMP recorded. Patient has had an ablation. Last Pap: 2017,  normal Last mammogram: 2017,  normal  Past Medical History:  Diagnosis Date  . Breast cancer, right, IDC, Stage II, Triple neg 11/16/2009   Her breast cancer was diagnosed on 11/16/2009. Was invasive ductal carcinoma. She underwent modified radical mastectomy on 12/21/2009. Invasive ductal carcinoma measured 2.8 cm, margins were not involved, and there was metastatic carcinoma in three of 20 lymph nodes. It was triple negative with a Ki-67 of 95%.Subsequent chemo by Dr Lamonte Sakai and radiation by Dr Tammi Klippel   . Cancer ([place])   . Hemorrhoids     Past Surgical History:  Procedure Laterality Date  . ABDOMINAL HYSTERECTOMY    . COLONOSCOPY  2010   Dr.Brodie- internal hemorrhoids o/w normal exam.  . HEMORRHOID BANDING  2016   Dr.Rourk  . MASTECTOMY  2011    OB History    No data available      Social History   Socioeconomic History  . Marital status: Single    Spouse name: None  . Number of children: None  . Years of education: None  . Highest education level: None  Social Needs  . Financial resource strain: None  . Food insecurity - worry: None  . Food insecurity - inability: None  . Transportation needs - medical: None  . Transportation needs - non-medical: None  Occupational History  . None  Tobacco Use  . Smoking status: Current Some Day Smoker    Packs/day: 0.50  . Smokeless tobacco: Never Used  . Tobacco comment: 3 cigs daily  Substance and Sexual Activity  . Alcohol use: Yes    Alcohol/week: 0.0 oz    Comment: SOCIAL  . Drug use: No  .  Sexual activity: None    Comment: 1 son  Other Topics Concern  . None  Social History Narrative  . None    No family history on file.   Current Outpatient Medications:  .  Biotin 1000 MCG tablet, Take 1,000 mcg by mouth 3 (three) times daily., Disp: , Rfl:  .  cholecalciferol (VITAMIN D) 1000 units tablet, Take 1,000 Units daily by mouth., Disp: , Rfl:  .  oxyCODONE-acetaminophen (PERCOCET/ROXICET) 5-325 MG tablet, Take 1 tablet by mouth every 6 (six) hours as needed for severe pain. (Patient not taking: Reported on 06/17/2017), Disp: 15 tablet, Rfl: 0 .  predniSONE (DELTASONE) 20 MG tablet, 3 tabs poqday 1-2, 2 tabs poqday 3-4, 1 tab poqday 5-6 (Patient not taking: Reported on 06/17/2017), Disp: 12 tablet, Rfl: 0  Review of Systems  Review of Systems  Constitutional: Negative for fever, chills, weight loss, malaise/fatigue and diaphoresis.  HENT: Negative for hearing loss, ear pain, nosebleeds, congestion, sore throat, neck pain, tinnitus and ear discharge.   Eyes: Negative for blurred vision, double vision, photophobia, pain, discharge and redness.  Respiratory: Negative for cough, hemoptysis, sputum production, shortness of breath, wheezing and stridor.   Cardiovascular: Negative for chest pain, palpitations, orthopnea, claudication, leg swelling and PND.  Gastrointestinal: negative for abdominal pain. Negative for heartburn, nausea, vomiting, diarrhea, constipation, blood in stool and melena.  Genitourinary: Negative for dysuria,  urgency, frequency, hematuria and flank pain.  Musculoskeletal: Negative for myalgias, back pain, joint pain and falls.  Skin: Negative for itching and rash.  Neurological: Negative for dizziness, tingling, tremors, sensory change, speech change, focal weakness, seizures, loss of consciousness, weakness and headaches.  Endo/Heme/Allergies: Negative for environmental allergies and polydipsia. Does not bruise/bleed easily.  Psychiatric/Behavioral: Negative  for depression, suicidal ideas, hallucinations, memory loss and substance abuse. The patient is not nervous/anxious and does not have insomnia.        Objective:  Blood pressure 132/70, pulse 76, height 5' (1.524 m), weight 148 lb (67.1 kg).   Physical Exam  Vitals reviewed. Constitutional: She is oriented to person, place, and time. She appears well-developed and well-nourished.  HENT:  Head: Normocephalic and atraumatic.        Right Ear: External ear normal.  Left Ear: External ear normal.  Nose: Nose normal.  Mouth/Throat: Oropharynx is clear and moist.  Eyes: Conjunctivae and EOM are normal. Pupils are equal, round, and reactive to light. Right eye exhibits no discharge. Left eye exhibits no discharge. No scleral icterus.  Neck: Normal range of motion. Neck supple. No tracheal deviation present. No thyromegaly present.  Cardiovascular: Normal rate, regular rhythm, normal heart sounds and intact distal pulses.  Exam reveals no gallop and no friction rub.   No murmur heard. Respiratory: Effort normal and breath sounds normal. No respiratory distress. She has no wheezes. She has no rales. She exhibits no tenderness.  GI: Soft. Bowel sounds are normal. She exhibits no distension and no mass. There is no tenderness. There is no rebound and no guarding.  Genitourinary:  Breasts no masses skin changes or nipple changes bilaterally      Vulva is normal without lesions Vagina is pink moist without discharge Cervix normal in appearance and pap is done Uterus is normal size shape and contour Adnexa is negative with normal sized ovaries   Musculoskeletal: Normal range of motion. She exhibits no edema and no tenderness.  Neurological: She is alert and oriented to person, place, and time. She has normal reflexes. She displays normal reflexes. No cranial nerve deficit. She exhibits normal muscle tone. Coordination normal.  Skin: Skin is warm and dry. No rash noted. No erythema. No pallor.   Psychiatric: She has a normal mood and affect. Her behavior is normal. Judgment and thought content normal.       Medications Ordered at today's visit: Meds ordered this encounter  Medications  . cholecalciferol (VITAMIN D) 1000 units tablet    Sig: Take 1,000 Units daily by mouth.    Other orders placed at today's visit: No orders of the defined types were placed in this encounter.     Assessment:    Healthy female exam.   Personal Hx of breast cancer Plan:    Mammogram ordered. Follow up in: 1 year.     No Follow-up on file.

## 2017-06-19 ENCOUNTER — Encounter: Payer: Self-pay | Admitting: Obstetrics & Gynecology

## 2017-06-19 LAB — CYTOLOGY - PAP
DIAGNOSIS: NEGATIVE
HPV (WINDOPATH): NOT DETECTED

## 2017-07-01 ENCOUNTER — Ambulatory Visit
Admission: RE | Admit: 2017-07-01 | Discharge: 2017-07-01 | Disposition: A | Discharge status: Home / Self Care | Payer: BLUE CROSS/BLUE SHIELD | Source: Ambulatory Visit | Attending: Obstetrics & Gynecology | Admitting: Obstetrics & Gynecology

## 2017-07-01 DIAGNOSIS — Z853 Personal history of malignant neoplasm of breast: Secondary | ICD-10-CM

## 2017-07-01 DIAGNOSIS — Z9011 Acquired absence of right breast and nipple: Secondary | ICD-10-CM

## 2017-07-01 DIAGNOSIS — Z1231 Encounter for screening mammogram for malignant neoplasm of breast: Secondary | ICD-10-CM | POA: Diagnosis not present

## 2017-07-01 HISTORY — DX: Personal history of antineoplastic chemotherapy: Z92.21

## 2017-07-01 HISTORY — DX: Personal history of irradiation: Z92.3

## 2018-05-16 IMAGING — CT CT ANKLE*R* W/O CM
3 series · 8 of 14 positions shown, 9 images · non-contrast
Comparison: 01/12/2017

CLINICAL DATA: Patient slipped and fell on 01/11/2017. Ankle
fracture.

EXAM:
CT OF THE RIGHT ANKLE WITHOUT CONTRAST
TECHNIQUE: Multidetector CT imaging of the right ankle was performed according
to the standard protocol. Multiplanar CT image reconstructions were
also generated.

[Series 5: lower ext soft · axial · 0.39mm/px · z∈[-38,+74]mm · 4 of 75 slices shown, 5 images]
[im 15/75  soft-tissue]
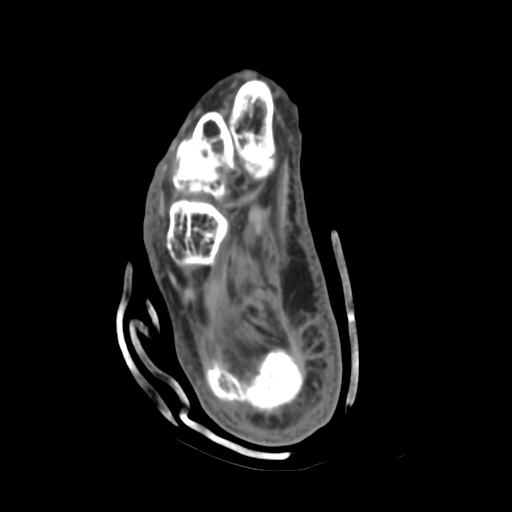
[im 15/75  bone]
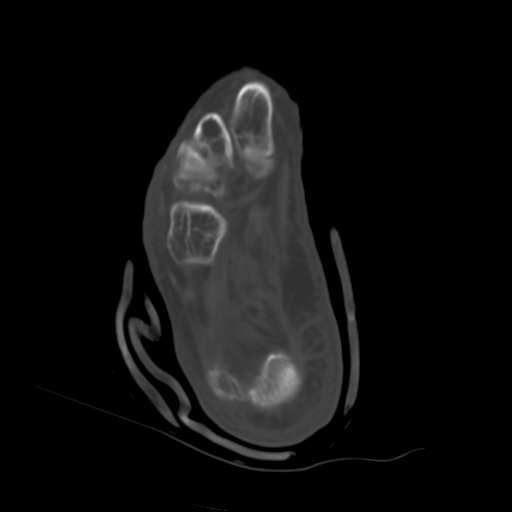
[im 30/75  bone]
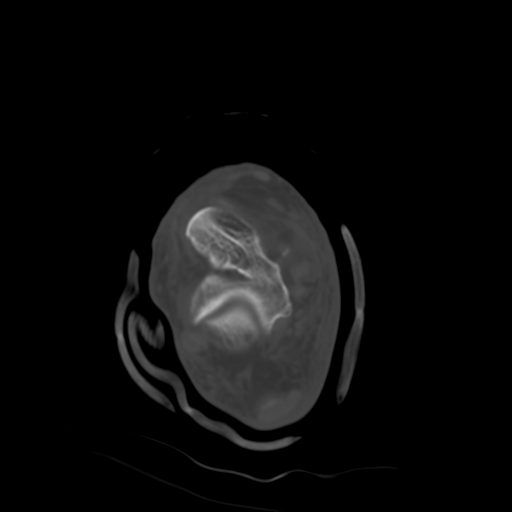
[im 45/75  bone]
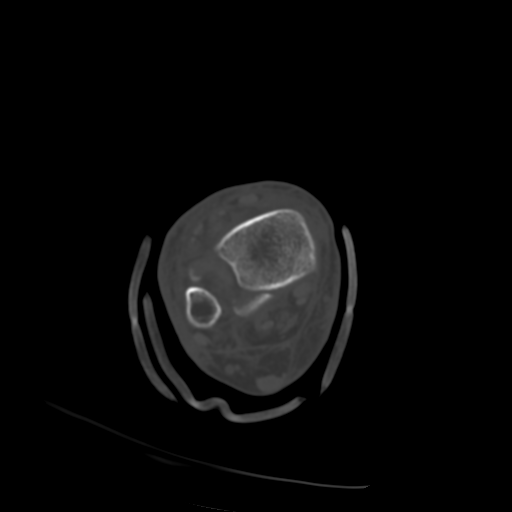
[im 60/75  bone]
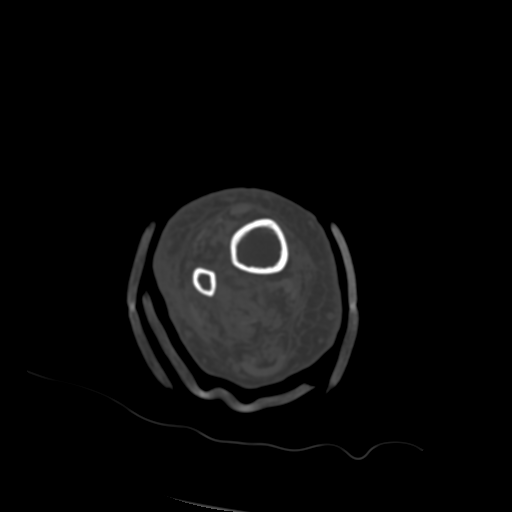

[Series 300: sag soft · sagittal · 0.39mm/px · 2 of 61 slices shown]
[im 21/61  soft-tissue]
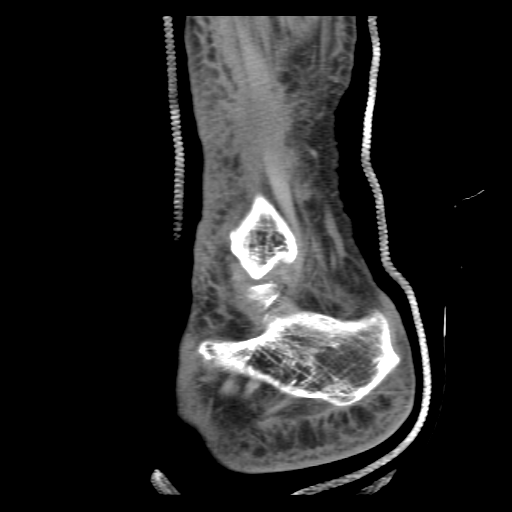
[im 41/61  soft-tissue]
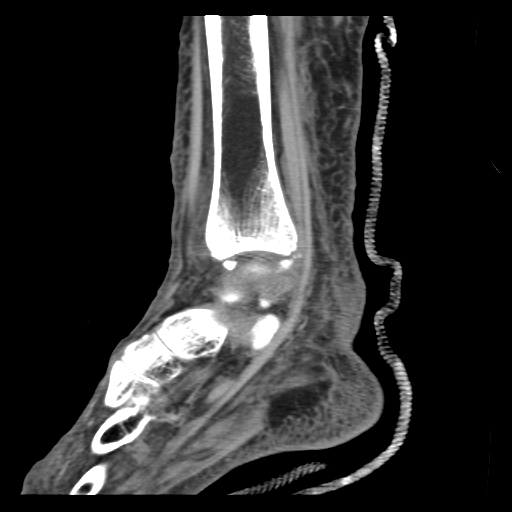

[Series 301: cor soft · coronal · 0.39mm/px · 2 of 54 slices shown]
[im 18/54  soft-tissue]
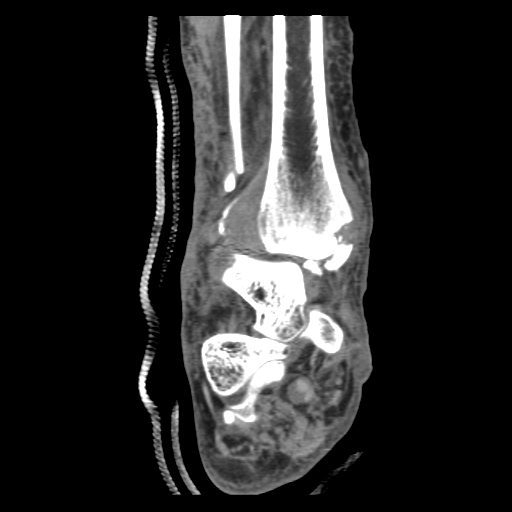
[im 36/54  soft-tissue]
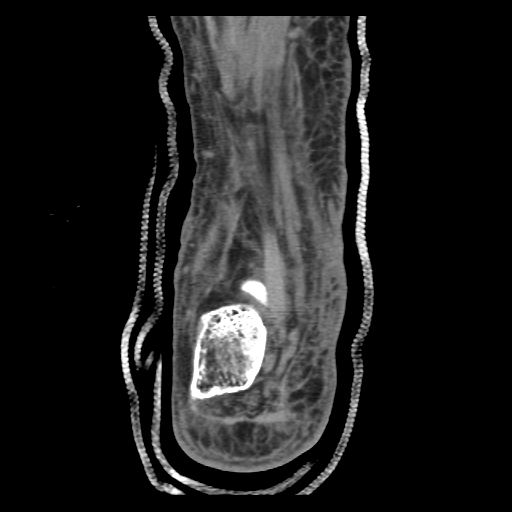

[8 of 14 positions shown; findings below may reference images not displayed]

FINDINGS: Bones/Joint/Cartilage

The following acute fractures and subluxations are noted:

1. Comminuted distal fibular diaphyseal fracture with lateral
angulation of the distal fracture fragment.
2. Intra-articular, transverse fracture of the medial malleolus with
medial clear space at the ankle joint 4 mm.
3. Lateral subluxation of the talar dome relative to the tibial
plafond by 11 mm. No significant dislocation or subluxation in the
AP direction.
4. Intra-articular posterior malleolar fracture involving the
posterolateral corner extending into the ankle joint. Up to 13 mm of
widening of the distal tibiofibular joint consistent with
syndesmotic tear.

Ligaments

Suboptimally assessed by CT.

Muscles and Tendons

No entrapment of the medial, lateral and anterior tendons crossing
the ankle joint. Intact Achilles tendon. No intramuscular
hemorrhage. Small calcaneal enthesophytes. Mild thickening of the
proximal plantar aponeurosis.

Soft tissues

Generalized diffuse periarticular soft tissue swelling.
IMPRESSION: 1. Comminuted distal fibular diaphyseal fracture with lateral
angulation of the distal fracture fragment.
2. Intra-articular, transverse fracture of the medial malleolus with
medial clear space at the ankle joint 4 mm.
3. Lateral subluxation of the talar dome relative to the tibial
plafond by 11 mm. No significant dislocation or subluxation in the
AP direction.
4. Intra-articular posterior malleolar fracture involving the
posterolateral corner extending into the ankle joint. Up to 13 mm of
widening of the distal tibiofibular joint consistent with
syndesmotic tear.

## 2018-05-22 ENCOUNTER — Other Ambulatory Visit: Payer: Self-pay | Admitting: Obstetrics & Gynecology

## 2018-05-22 DIAGNOSIS — Z1231 Encounter for screening mammogram for malignant neoplasm of breast: Secondary | ICD-10-CM

## 2018-07-06 ENCOUNTER — Ambulatory Visit
Admission: RE | Admit: 2018-07-06 | Discharge: 2018-07-06 | Disposition: A | Discharge status: Home / Self Care | Payer: BLUE CROSS/BLUE SHIELD | Source: Ambulatory Visit | Attending: Obstetrics & Gynecology | Admitting: Obstetrics & Gynecology

## 2018-07-06 DIAGNOSIS — Z1231 Encounter for screening mammogram for malignant neoplasm of breast: Secondary | ICD-10-CM | POA: Diagnosis not present

## 2018-08-18 ENCOUNTER — Ambulatory Visit: Payer: BLUE CROSS/BLUE SHIELD | Admitting: Family Medicine

## 2018-08-18 ENCOUNTER — Encounter: Payer: Self-pay | Admitting: Obstetrics & Gynecology

## 2018-08-18 ENCOUNTER — Encounter: Payer: Self-pay | Admitting: Family Medicine

## 2018-08-18 ENCOUNTER — Ambulatory Visit (INDEPENDENT_AMBULATORY_CARE_PROVIDER_SITE_OTHER): Payer: BLUE CROSS/BLUE SHIELD | Admitting: Obstetrics & Gynecology

## 2018-08-18 VITALS — BP 180/120 | HR 74 | Temp 97.8°F | Resp 16 | Ht 60.0 in | Wt 150.0 lb

## 2018-08-18 VITALS — BP 157/97 | HR 70 | Ht 60.75 in | Wt 148.5 lb

## 2018-08-18 DIAGNOSIS — Z01419 Encounter for gynecological examination (general) (routine) without abnormal findings: Secondary | ICD-10-CM | POA: Diagnosis not present

## 2018-08-18 DIAGNOSIS — Z1212 Encounter for screening for malignant neoplasm of rectum: Secondary | ICD-10-CM

## 2018-08-18 DIAGNOSIS — Z853 Personal history of malignant neoplasm of breast: Secondary | ICD-10-CM | POA: Diagnosis not present

## 2018-08-18 DIAGNOSIS — H6123 Impacted cerumen, bilateral: Secondary | ICD-10-CM

## 2018-08-18 DIAGNOSIS — I10 Essential (primary) hypertension: Secondary | ICD-10-CM

## 2018-08-18 DIAGNOSIS — Z1211 Encounter for screening for malignant neoplasm of colon: Secondary | ICD-10-CM | POA: Diagnosis not present

## 2018-08-18 MED ORDER — HYDROCHLOROTHIAZIDE 25 MG PO TABS: 25.0000 mg | ORAL_TABLET | Freq: Every day | ORAL | 3 refills | Status: DC

## 2018-08-18 NOTE — Progress Notes (Signed)
Subjective:    Patient ID: Debbie Woods, female    DOB: 03-06-1961, 58 y.o.   MRN: [id number]  HPI Patient is a very pleasant 58 year old Caucasian female who presents today complaining of bilateral hearing loss.  She has a history of cerumen impactions in both ears and she states that it feels similar to when she had this previously.  Of note she saw her gynecologist for her annual exam this morning and her blood pressure was elevated.  I have reviewed their blood pressures here.  On our initial encounter, her blood pressure was 180/120.  After sitting and relaxing, her blood pressure fell to 160/100 but this is consistent with the blood pressure she was having this morning at her gynecologist.  She denies any chest pain shortness of breath or dyspnea on exertion. Past Medical History:  Diagnosis Date  . Breast cancer, right, IDC, Stage II, Triple neg 11/16/2009   Her breast cancer was diagnosed on 11/16/2009. Was invasive ductal carcinoma. She underwent modified radical mastectomy on 12/21/2009. Invasive ductal carcinoma measured 2.8 cm, margins were not involved, and there was metastatic carcinoma in three of 20 lymph nodes. It was triple negative with a Ki-67 of 95%.Subsequent chemo by Dr Lamonte Sakai and radiation by Dr Tammi Klippel   . Cancer ([place])   . Hemorrhoids   . Personal history of chemotherapy 2011  . Personal history of radiation therapy 2011   Past Surgical History:  Procedure Laterality Date  . BREAST BIOPSY Left 2016  . COLONOSCOPY  2010   Dr.Brodie- internal hemorrhoids o/w normal exam.  . ENDOMETRIAL ABLATION  2012  . HEMORRHOID BANDING  2016   Dr.Rourk  . MASTECTOMY Right 2011   Current Outpatient Medications on File Prior to Visit  Medication Sig Dispense Refill  . Biotin 1000 MCG tablet Take 2,000 mcg by mouth daily.     . cholecalciferol (VITAMIN D) 1000 units tablet Take 1,000 Units daily by mouth.     No current facility-administered medications on file prior to visit.     Allergies  Allergen Reactions  . Latex Rash   Social History   Socioeconomic History  . Marital status: Single    Spouse name: Not on file  . Number of children: Not on file  . Years of education: Not on file  . Highest education level: Not on file  Occupational History  . Not on file  Social Needs  . Financial resource strain: Not on file  . Food insecurity:    Worry: Not on file    Inability: Not on file  . Transportation needs:    Medical: Not on file    Non-medical: Not on file  Tobacco Use  . Smoking status: Former Smoker    Packs/day: 0.50    Types: Cigarettes  . Smokeless tobacco: Never Used  . Tobacco comment: 3 cigs daily  Substance and Sexual Activity  . Alcohol use: Yes    Alcohol/week: 0.0 standard drinks    Comment: SOCIAL  . Drug use: No  . Sexual activity: Yes    Birth control/protection: Surgical    Comment: ablation  Lifestyle  . Physical activity:    Days per week: Not on file    Minutes per session: Not on file  . Stress: Not on file  Relationships  . Social connections:    Talks on phone: Not on file    Gets together: Not on file    Attends religious service: Not on file    Active  member of club or organization: Not on file    Attends meetings of clubs or organizations: Not on file    Relationship status: Not on file  . Intimate partner violence:    Fear of current or ex partner: Not on file    Emotionally abused: Not on file    Physically abused: Not on file    Forced sexual activity: Not on file  Other Topics Concern  . Not on file  Social History Narrative  . Not on file      Review of Systems  All other systems reviewed and are negative.      Objective:   Physical Exam  Constitutional: She appears well-developed and well-nourished. No distress.  HENT:  Right Ear: External ear normal. Decreased hearing is noted.  Left Ear: External ear normal. Decreased hearing is noted.  Nose: Right sinus exhibits no maxillary sinus  tenderness and no frontal sinus tenderness. Left sinus exhibits no maxillary sinus tenderness and no frontal sinus tenderness.  Mouth/Throat: Oropharynx is clear and moist. No oropharyngeal exudate.  Eyes: Conjunctivae are normal.  Neck: Neck supple.  Cardiovascular: Normal rate, regular rhythm and normal heart sounds.  No murmur heard. Pulmonary/Chest: Effort normal and breath sounds normal. No respiratory distress. She has no wheezes. She has no rales.  Lymphadenopathy:    She has no cervical adenopathy.  Skin: She is not diaphoretic.  Vitals reviewed.   Bilateral cerumen impaction.  Repeat blood pressure was 160/100      Assessment & Plan:  Benign essential HTN - Plan: BASIC METABOLIC PANEL WITH GFR   Patient has bilateral cerumen impactions. Both ears were cleared with lavage/irrigation. The patient tolerated the procedure without complication.    I am very concerned about her blood pressure.  I have recommended starting hydrochlorothiazide 25 mg a day and then rechecking blood pressure in 2 weeks when she returns from her vacation.  I will also check a BMP to check her renal function.  Patient has blood pressure cuff at home.  Therefore I will asked her to start checking her blood pressure at home.  If much better at home (less than 140/90) she does not need to start taking the medication however I do not want her to walk around stage II hypertension untreated.  Follow-up in 2 weeks to recheck her blood pressure

## 2018-08-18 NOTE — Progress Notes (Signed)
Subjective:     Debbie Woods is a 57 y.o. female here for a routine exam.  No LMP recorded. Patient has had an ablation. G1P1001 Birth Control Method:  ablation Menstrual Calendar(currently): amenorrheic  Current complaints: none.   Current acute medical issues:  BP is elevated today   Recent Gynecologic History No LMP recorded. Patient has had an ablation. Last Pap: 2018,  normal Last mammogram: 2019,  normal  Past Medical History:  Diagnosis Date  . Breast cancer, right, IDC, Stage II, Triple neg 11/16/2009   Her breast cancer was diagnosed on 11/16/2009. Was invasive ductal carcinoma. She underwent modified radical mastectomy on 12/21/2009. Invasive ductal carcinoma measured 2.8 cm, margins were not involved, and there was metastatic carcinoma in three of 20 lymph nodes. It was triple negative with a Ki-67 of 95%.Subsequent chemo by Dr Ha and radiation by Dr Manning   . Cancer (HCC)   . Hemorrhoids   . Personal history of chemotherapy 2011  . Personal history of radiation therapy 2011    Past Surgical History:  Procedure Laterality Date  . BREAST BIOPSY Left 2016  . COLONOSCOPY  2010   Dr.Brodie- internal hemorrhoids o/w normal exam.  . ENDOMETRIAL ABLATION  2012  . HEMORRHOID BANDING  2016   Dr.Rourk  . MASTECTOMY Right 2011    OB History    Gravida  1   Para  1   Term  1   Preterm      AB      Living  1     SAB      TAB      Ectopic      Multiple      Live Births  1           Social History   Socioeconomic History  . Marital status: Single    Spouse name: Not on file  . Number of children: Not on file  . Years of education: Not on file  . Highest education level: Not on file  Occupational History  . Not on file  Social Needs  . Financial resource strain: Not on file  . Food insecurity:    Worry: Not on file    Inability: Not on file  . Transportation needs:    Medical: Not on file    Non-medical: Not on file  Tobacco Use  .  Smoking status: Former Smoker    Packs/day: 0.50    Types: Cigarettes  . Smokeless tobacco: Never Used  . Tobacco comment: 3 cigs daily  Substance and Sexual Activity  . Alcohol use: Yes    Alcohol/week: 0.0 standard drinks    Comment: SOCIAL  . Drug use: No  . Sexual activity: Yes    Birth control/protection: Surgical    Comment: ablation  Lifestyle  . Physical activity:    Days per week: Not on file    Minutes per session: Not on file  . Stress: Not on file  Relationships  . Social connections:    Talks on phone: Not on file    Gets together: Not on file    Attends religious service: Not on file    Active member of club or organization: Not on file    Attends meetings of clubs or organizations: Not on file    Relationship status: Not on file  Other Topics Concern  . Not on file  Social History Narrative  . Not on file    Family History  Problem Relation Age   of Onset  . Breast cancer Cousin      Current Outpatient Medications:  .  Biotin 1000 MCG tablet, Take 2,000 mcg by mouth daily. , Disp: , Rfl:  .  cholecalciferol (VITAMIN D) 1000 units tablet, Take 1,000 Units daily by mouth., Disp: , Rfl:   Review of Systems  Review of Systems  Constitutional: Negative for fever, chills, weight loss, malaise/fatigue and diaphoresis.  HENT: Negative for hearing loss, ear pain, nosebleeds, congestion, sore throat, neck pain, tinnitus and ear discharge.   Eyes: Negative for blurred vision, double vision, photophobia, pain, discharge and redness.  Respiratory: Negative for cough, hemoptysis, sputum production, shortness of breath, wheezing and stridor.   Cardiovascular: Negative for chest pain, palpitations, orthopnea, claudication, leg swelling and PND.  Gastrointestinal: negative for abdominal pain. Negative for heartburn, nausea, vomiting, diarrhea, constipation, blood in stool and melena.  Genitourinary: Negative for dysuria, urgency, frequency, hematuria and flank pain.   Musculoskeletal: Negative for myalgias, back pain, joint pain and falls.  Skin: Negative for itching and rash.  Neurological: Negative for dizziness, tingling, tremors, sensory change, speech change, focal weakness, seizures, loss of consciousness, weakness and headaches.  Endo/Heme/Allergies: Negative for environmental allergies and polydipsia. Does not bruise/bleed easily.  Psychiatric/Behavioral: Negative for depression, suicidal ideas, hallucinations, memory loss and substance abuse. The patient is not nervous/anxious and does not have insomnia.        Objective:  Blood pressure (!) 157/97, pulse 70, height 5' 0.75" (1.543 m), weight 148 lb 8 oz (67.4 kg).   Physical Exam  Vitals reviewed. Constitutional: She is oriented to person, place, and time. She appears well-developed and well-nourished.  HENT:  Head: Normocephalic and atraumatic.        Right Ear: External ear normal.  Left Ear: External ear normal.  Nose: Nose normal.  Mouth/Throat: Oropharynx is clear and moist.  Eyes: Conjunctivae and EOM are normal. Pupils are equal, round, and reactive to light. Right eye exhibits no discharge. Left eye exhibits no discharge. No scleral icterus.  Neck: Normal range of motion. Neck supple. No tracheal deviation present. No thyromegaly present.  Cardiovascular: Normal rate, regular rhythm, normal heart sounds and intact distal pulses.  Exam reveals no gallop and no friction rub.   No murmur heard. Respiratory: Effort normal and breath sounds normal. No respiratory distress. She has no wheezes. She has no rales. She exhibits no tenderness.  GI: Soft. Bowel sounds are normal. She exhibits no distension and no mass. There is no tenderness. There is no rebound and no guarding.  Genitourinary:  Breasts no masses skin changes or nipple changes bilaterally      Vulva is normal without lesions Vagina is pink moist without discharge Cervix normal in appearance and pap is done Uterus is normal  size shape and contour Adnexa is negative with normal sized ovaries  {Rectal    hemoccult negative, normal tone, no masses  Musculoskeletal: Normal range of motion. She exhibits no edema and no tenderness.  Neurological: She is alert and oriented to person, place, and time. She has normal reflexes. She displays normal reflexes. No cranial nerve deficit. She exhibits normal muscle tone. Coordination normal.  Skin: Skin is warm and dry. No rash noted. No erythema. No pallor.  Psychiatric: She has a normal mood and affect. Her behavior is normal. Judgment and thought content normal.       Medications Ordered at today's visit: No orders of the defined types were placed in this encounter.   Other orders placed at   today's visit: No orders of the defined types were placed in this encounter.     Assessment:    Healthy female exam.    Plan:    Mammogram ordered. Follow up in: 1 year.     Return in about 1 year (around 08/19/2019) for yearly, with Dr Eure.  

## 2018-08-19 LAB — BASIC METABOLIC PANEL WITH GFR
BUN: 9 mg/dL (ref 7–25)
CO2: 27 mmol/L (ref 20–32)
Calcium: 9.8 mg/dL (ref 8.6–10.4)
Chloride: 99 mmol/L (ref 98–110)
Creat: 0.55 mg/dL (ref 0.50–1.05)
GFR, EST NON AFRICAN AMERICAN: 106 mL/min/{1.73_m2} (ref >=60)
GFR, Est African American: 122 mL/min/{1.73_m2} (ref >=60)
Glucose, Bld: 82 mg/dL (ref 65–99)
POTASSIUM: 4.3 mmol/L (ref 3.5–5.3)
SODIUM: 137 mmol/L (ref 135–146)

## 2019-02-19 DIAGNOSIS — C50911 Malignant neoplasm of unspecified site of right female breast: Secondary | ICD-10-CM | POA: Diagnosis not present

## 2019-03-08 DIAGNOSIS — C50911 Malignant neoplasm of unspecified site of right female breast: Secondary | ICD-10-CM | POA: Diagnosis not present

## 2019-06-09 ENCOUNTER — Other Ambulatory Visit: Payer: Self-pay | Admitting: Obstetrics & Gynecology

## 2019-06-09 DIAGNOSIS — Z1231 Encounter for screening mammogram for malignant neoplasm of breast: Secondary | ICD-10-CM

## 2019-08-02 ENCOUNTER — Ambulatory Visit
Admission: RE | Admit: 2019-08-02 | Discharge: 2019-08-02 | Disposition: A | Discharge status: Home / Self Care | Payer: BLUE CROSS/BLUE SHIELD | Source: Ambulatory Visit | Attending: Obstetrics & Gynecology | Admitting: Obstetrics & Gynecology

## 2019-08-02 ENCOUNTER — Other Ambulatory Visit: Payer: Self-pay

## 2019-08-02 DIAGNOSIS — Z1231 Encounter for screening mammogram for malignant neoplasm of breast: Secondary | ICD-10-CM | POA: Diagnosis not present

## 2019-10-15 ENCOUNTER — Ambulatory Visit: Payer: BLUE CROSS/BLUE SHIELD | Attending: Internal Medicine

## 2019-10-15 DIAGNOSIS — Z23 Encounter for immunization: Secondary | ICD-10-CM

## 2019-10-15 NOTE — Progress Notes (Signed)
   Covid-19 Vaccination Clinic  Name:  Debbie Woods    MRN: [id number] DOB: [birth year]  10/15/2019  [name] was observed post Covid-19 immunization for 30 minutes based on pre-vaccination screening without incident. She was provided with Vaccine Information Sheet and instruction to access the V-Safe system.   [name] was instructed to call 911 with any severe reactions post vaccine: Marland Kitchen Difficulty breathing  . Swelling of face and throat  . A fast heartbeat  . A bad rash all over body  . Dizziness and weakness   Immunizations Administered    Name Date Dose VIS Date Route   Pfizer COVID-19 Vaccine 10/15/2019  8:15 AM 0.3 mL 07/16/2019 Intramuscular   Manufacturer: [place]   Lot: [id number]   [place]: [id number]

## 2019-11-09 ENCOUNTER — Ambulatory Visit: Payer: BLUE CROSS/BLUE SHIELD | Attending: Internal Medicine

## 2019-11-09 DIAGNOSIS — Z23 Encounter for immunization: Secondary | ICD-10-CM

## 2019-11-09 NOTE — Progress Notes (Signed)
   Covid-19 Vaccination Clinic  Name:  AYLANIE GARINO    MRN: [id number] DOB: June 02, 1961  11/09/2019  Ms. Cata was observed post Covid-19 immunization for 15 minutes without incident. She was provided with Vaccine Information Sheet and instruction to access the V-Safe system.   Ms. Behl was instructed to call 911 with any severe reactions post vaccine: Marland Kitchen Difficulty breathing  . Swelling of face and throat  . A fast heartbeat  . A bad rash all over body  . Dizziness and weakness   Immunizations Administered    Name Date Dose VIS Date Route   Pfizer COVID-19 Vaccine 11/09/2019  8:31 AM 0.3 mL 07/16/2019 Intramuscular   Manufacturer: [employer]   Lot: [id number]   [place]: [id number]

## 2020-06-23 ENCOUNTER — Other Ambulatory Visit: Payer: Self-pay | Admitting: Obstetrics & Gynecology

## 2020-06-23 DIAGNOSIS — Z1231 Encounter for screening mammogram for malignant neoplasm of breast: Secondary | ICD-10-CM

## 2020-07-26 ENCOUNTER — Encounter: Payer: Self-pay | Admitting: Nurse Practitioner

## 2020-07-26 ENCOUNTER — Other Ambulatory Visit: Payer: Self-pay

## 2020-07-26 ENCOUNTER — Ambulatory Visit: Payer: BLUE CROSS/BLUE SHIELD | Admitting: Nurse Practitioner

## 2020-07-26 VITALS — BP 154/92 | HR 86 | Temp 97.6°F | Ht 60.0 in | Wt 135.0 lb

## 2020-07-26 DIAGNOSIS — H6123 Impacted cerumen, bilateral: Secondary | ICD-10-CM | POA: Diagnosis not present

## 2020-07-26 DIAGNOSIS — R030 Elevated blood-pressure reading, without diagnosis of hypertension: Secondary | ICD-10-CM | POA: Diagnosis not present

## 2020-07-26 NOTE — Assessment & Plan Note (Signed)
BP ~150/90 today in office.  Patients reports BP at home is consistently less than 140/90 and closer to 130/80 most of the time.  Given reports of increased stress due to the holidays and work currently, elevated BP today likely multifactorial.  Educated patient to notify us if BP consistently above goal of 140/90, want to minimize any stress on the heart.

## 2020-07-26 NOTE — Assessment & Plan Note (Signed)
Acute, resolved.  Ears flushed bilaterally with warm water and hydrogen peroxide solution.  Impacted cerumen successfully removed from both ear canals.  Patient tolerated procedure well and denies any dizziness; reports hearing has improved to left ear.  Instructed on OTC Debrox drops, can use as needed to prevent cerumen from building up in future.  Follow up as needed.

## 2020-07-26 NOTE — Progress Notes (Signed)
  Subjective:    Patient ID: Debbie Woods, female    DOB: 07/12/1961, 59 y.o.   MRN: [id number]  HPI: Debbie Woods is a 59 y.o. female presenting for ear fullness.  Chief Complaint  Patient presents with  . Ear Fullness    Needs ears cleaned   EAG CLOGGED Duration: Friday Involved ear(s): L >R Sensation of feeling clogged/plugged: yes Decreased/muffled hearing:yes Ear pain: no Fever: no Otorrhea: no Hearing loss: yes Upper respiratory infection symptoms: no Using Q-Tips: no Status: stable History of cerumenosis: yes Treatments attempted: tried a little bit of peroxide  Allergies  Allergen Reactions  . Latex Rash    Outpatient Encounter Medications as of 07/26/2020  Medication Sig  . Biotin 1000 MCG tablet Take 2,000 mcg by mouth daily.   . cholecalciferol (VITAMIN D) 1000 units tablet Take 1,000 Units daily by mouth.  . [DISCONTINUED] hydrochlorothiazide (HYDRODIURIL) 25 MG tablet Take 1 tablet (25 mg total) by mouth daily.   No facility-administered encounter medications on file as of 07/26/2020.    Patient Active Problem List   Diagnosis Date Noted  . Excessive cerumen in both ear canals 07/26/2020  . Elevated BP without diagnosis of hypertension 07/26/2020  . Tobacco abuse 12/23/2014  . Hemorrhoids 09/22/2014  . hx: breast cancer right IDC triple negative 01/10/2012    Past Medical History:  Diagnosis Date  . Breast cancer, right, IDC, Stage II, Triple neg 11/16/2009   Her breast cancer was diagnosed on 11/16/2009. Was invasive ductal carcinoma. She underwent modified radical mastectomy on 12/21/2009. Invasive ductal carcinoma measured 2.8 cm, margins were not involved, and there was metastatic carcinoma in three of 20 lymph nodes. It was triple negative with a Ki-67 of 95%.Subsequent chemo by Dr Ha and radiation by Dr Manning   . Cancer (HCC)   . Hemorrhoids   . Personal history of chemotherapy 2011  . Personal history of radiation therapy 2011    Relevant past medical, surgical, family and social history reviewed and updated as indicated. Interim medical history since our last visit reviewed.  Review of Systems  Constitutional: Negative.  Negative for activity change, appetite change, fatigue and fever.  HENT: Positive for ear pain and hearing loss. Negative for congestion, drooling, ear discharge, facial swelling, mouth sores, postnasal drip, rhinorrhea, sinus pressure, sinus pain, sneezing, sore throat and trouble swallowing.   Eyes: Negative.  Negative for pain, discharge, redness and itching.  Skin: Negative.   Neurological: Negative.   Psychiatric/Behavioral: Negative.    Per HPI unless specifically indicated above    Objective:    BP (!) 154/92   Pulse 86   Temp 97.6 F (36.4 C)   Ht 5' (1.524 m)   Wt 135 lb (61.2 kg)   SpO2 97%   BMI 26.37 kg/m   Wt Readings from Last 3 Encounters:  07/26/20 135 lb (61.2 kg)  08/18/18 150 lb (68 kg)  08/18/18 148 lb 8 oz (67.4 kg)    Physical Exam Vitals and nursing note reviewed.  Constitutional:      General: She is not in acute distress.    Appearance: Normal appearance. She is not toxic-appearing.  HENT:     Head: Normocephalic and atraumatic.     Right Ear: Ear canal and external ear normal. There is impacted cerumen.     Left Ear: Ear canal and external ear normal. There is impacted cerumen.     Nose: Nose normal. No congestion or rhinorrhea.     Mouth/Throat:       Mouth: Mucous membranes are moist.     Pharynx: Oropharynx is clear. No oropharyngeal exudate or posterior oropharyngeal erythema.  Musculoskeletal:     Cervical back: Normal range of motion.  Lymphadenopathy:     Cervical: No cervical adenopathy.  Neurological:     Mental Status: She is alert and oriented to person, place, and time.     Motor: No weakness.  Psychiatric:        Mood and Affect: Mood normal.        Behavior: Behavior normal.        Thought Content: Thought content normal.         Judgment: Judgment normal.        Assessment & Plan:   Problem List Items Addressed This Visit      Nervous and Auditory   Excessive cerumen in both ear canals - Primary    Acute, resolved.  Ears flushed bilaterally with warm water and hydrogen peroxide solution.  Impacted cerumen successfully removed from both ear canals.  Patient tolerated procedure well and denies any dizziness; reports hearing has improved to left ear.  Instructed on OTC Debrox drops, can use as needed to prevent cerumen from building up in future.  Follow up as needed.        Other   Elevated BP without diagnosis of hypertension    BP ~150/90 today in office.  Patients reports BP at home is consistently less than 140/90 and closer to 130/80 most of the time.  Given reports of increased stress due to the holidays and work currently, elevated BP today likely multifactorial.  Educated patient to notify us if BP consistently above goal of 140/90, want to minimize any stress on the heart.           Follow up plan: Return if symptoms worsen or fail to improve.

## 2020-07-26 NOTE — Patient Instructions (Signed)
Carbamide Peroxide ear solution What is this medicine? CARBAMIDE PEROXIDE (CAR bah mide per OX ide) is used to soften and help remove ear wax. This medicine may be used for other purposes; ask your health care provider or pharmacist if you have questions. COMMON BRAND NAME(S): Auro Ear, Auro Earache Relief, Debrox, Ear Drops, Ear Wax Removal, Ear Wax Remover, Earwax Treatment, Murine, Thera-Ear What should I tell my health care provider before I take this medicine? They need to know if you have any of these conditions:  dizziness  ear discharge  ear pain, irritation or rash  infection  perforated eardrum (hole in eardrum)  an unusual or allergic reaction to carbamide peroxide, glycerin, hydrogen peroxide, other medicines, foods, dyes, or preservatives  pregnant or trying to get pregnant  breast-feeding How should I use this medicine? This medicine is only for use in the outer ear canal. Follow the directions carefully. Wash hands before and after use. The solution may be warmed by holding the bottle in the hand for 1 to 2 minutes. Lie with the affected ear facing upward. Place the proper number of drops into the ear canal. After the drops are instilled, remain lying with the affected ear upward for 5 minutes to help the drops stay in the ear canal. A cotton ball may be gently inserted at the ear opening for no longer than 5 to 10 minutes to ensure retention. Repeat, if necessary, for the opposite ear. Do not touch the tip of the dropper to the ear, fingertips, or other surface. Do not rinse the dropper after use. Keep container tightly closed. Talk to your pediatrician regarding the use of this medicine in children. While this drug may be used in children as young as 12 years for selected conditions, precautions do apply. Overdosage: If you think you have taken too much of this medicine contact a poison control center or emergency room at once. NOTE: This medicine is only for you. Do not  share this medicine with others. What if I miss a dose? If you miss a dose, use it as soon as you can. If it is almost time for your next dose, use only that dose. Do not use double or extra doses. What may interact with this medicine? Interactions are not expected. Do not use any other ear products without asking your doctor or health care professional. This list may not describe all possible interactions. Give your health care provider a list of all the medicines, herbs, non-prescription drugs, or dietary supplements you use. Also tell them if you smoke, drink alcohol, or use illegal drugs. Some items may interact with your medicine. What should I watch for while using this medicine? This medicine is not for long-term use. Do not use for more than 4 days without checking with your health care professional. Contact your doctor or health care professional if your condition does not start to get better within a few days or if you notice burning, redness, itching or swelling. What side effects may I notice from receiving this medicine? Side effects that you should report to your doctor or health care professional as soon as possible:  allergic reactions like skin rash, itching or hives, swelling of the face, lips, or tongue  burning, itching, and redness  worsening ear pain  rash Side effects that usually do not require medical attention (report to your doctor or health care professional if they continue or are bothersome):  abnormal sensation while putting the drops in the ear  temporary reduction in hearing (but not complete loss of hearing) This list may not describe all possible side effects. Call your doctor for medical advice about side effects. You may report side effects to FDA at 1-800-FDA-1088. Where should I keep my medicine? Keep out of the reach of children. Store at room temperature between 15 and 30 degrees C (59 and 86 degrees F) in a tight, light-resistant container. Keep  bottle away from excessive heat and direct sunlight. Throw away any unused medicine after the expiration date. NOTE: This sheet is a summary. It may not cover all possible information. If you have questions about this medicine, talk to your doctor, pharmacist, or health care provider.  2020 Elsevier/Gold Standard (2007-11-03 14:00:02)  

## 2020-08-09 ENCOUNTER — Ambulatory Visit
Admission: RE | Admit: 2020-08-09 | Discharge: 2020-08-09 | Disposition: A | Discharge status: Home / Self Care | Payer: BLUE CROSS/BLUE SHIELD | Source: Ambulatory Visit | Attending: Obstetrics & Gynecology | Admitting: Obstetrics & Gynecology

## 2020-08-09 ENCOUNTER — Other Ambulatory Visit: Payer: Self-pay

## 2020-08-09 DIAGNOSIS — Z1231 Encounter for screening mammogram for malignant neoplasm of breast: Secondary | ICD-10-CM

## 2020-08-10 ENCOUNTER — Other Ambulatory Visit: Payer: Self-pay | Admitting: Obstetrics & Gynecology

## 2020-08-10 DIAGNOSIS — R928 Other abnormal and inconclusive findings on diagnostic imaging of breast: Secondary | ICD-10-CM

## 2020-08-24 ENCOUNTER — Other Ambulatory Visit: Payer: Self-pay

## 2020-08-24 ENCOUNTER — Ambulatory Visit
Admission: RE | Admit: 2020-08-24 | Discharge: 2020-08-24 | Disposition: A | Discharge status: Home / Self Care | Payer: BLUE CROSS/BLUE SHIELD | Source: Ambulatory Visit | Attending: Obstetrics & Gynecology | Admitting: Obstetrics & Gynecology

## 2020-08-24 ENCOUNTER — Ambulatory Visit: Payer: BLUE CROSS/BLUE SHIELD

## 2020-08-24 DIAGNOSIS — R928 Other abnormal and inconclusive findings on diagnostic imaging of breast: Secondary | ICD-10-CM

## 2020-08-24 DIAGNOSIS — R922 Inconclusive mammogram: Secondary | ICD-10-CM | POA: Diagnosis not present

## 2020-12-11 ENCOUNTER — Other Ambulatory Visit: Payer: Self-pay

## 2020-12-11 ENCOUNTER — Encounter: Payer: Self-pay | Admitting: Family Medicine

## 2020-12-11 ENCOUNTER — Ambulatory Visit: Payer: BLUE CROSS/BLUE SHIELD | Admitting: Family Medicine

## 2020-12-11 VITALS — BP 114/70 | HR 82 | Temp 97.5°F | Resp 14 | Ht 60.0 in | Wt 131.8 lb

## 2020-12-11 DIAGNOSIS — R5382 Chronic fatigue, unspecified: Secondary | ICD-10-CM | POA: Diagnosis not present

## 2020-12-11 DIAGNOSIS — R7309 Other abnormal glucose: Secondary | ICD-10-CM | POA: Diagnosis not present

## 2020-12-11 NOTE — Progress Notes (Signed)
Subjective:    Patient ID: Debbie Woods, female    DOB: [birth year], 60 y.o.   MRN: [id number]  HPI Patient is a very sweet 60 year old Caucasian female who presents today with fatigue.  She has a remote past medical history of breast cancer in her right breast in 2011.  Was invasive ductal carcinoma.  Underwent right radical mastectomy.  She did have metastatic carcinoma in 3 of the 20 lymph nodes.  She underwent chemotherapy and radiation therapy.  She had a mammogram performed in January that was clear.  She presents today with a 3-day history of fatigue.  She states that she was feeling fine Friday.  However all day Saturday and all day Sunday she felt extremely tired.  She states she feels like she can just sleep all the time.  She has no energy.  She states that she feels like if I left her alone she could fall asleep here in the exam room.  This is a sudden change for her.  However her review of systems is otherwise negative.  She denies any fever or chills or night sweats.  She denies any rapid weight loss.  She denies any chest pain, palpitations, shortness of breath, dyspnea on exertion.  She denies any melena or hematochezia or diarrhea or constipation.  She denies any vaginal bleeding.  She denies any nausea or vomiting.  She has not had any illnesses recently.  She does report some mild nasal congestion related to allergies but is otherwise doing well.  Her exam today is also unremarkable Past Medical History:  Diagnosis Date  . Breast cancer, right, IDC, Stage II, Triple neg 11/16/2009   Her breast cancer was diagnosed on 11/16/2009. Was invasive ductal carcinoma. She underwent modified radical mastectomy on 12/21/2009. Invasive ductal carcinoma measured 2.8 cm, margins were not involved, and there was metastatic carcinoma in three of 20 lymph nodes. It was triple negative with a Ki-67 of 95%.Subsequent chemo by Dr Lamonte Sakai and radiation by Dr Tammi Klippel   . Cancer ([place])   . Hemorrhoids   . Personal  history of chemotherapy 2011  . Personal history of radiation therapy 2011   Past Surgical History:  Procedure Laterality Date  . BREAST BIOPSY Left 2016  . COLONOSCOPY  2010   Dr.Brodie- internal hemorrhoids o/w normal exam.  . ENDOMETRIAL ABLATION  2012  . HEMORRHOID BANDING  2016   Dr.Rourk  . MASTECTOMY Right 2011   Current Outpatient Medications on File Prior to Visit  Medication Sig Dispense Refill  . Biotin 1000 MCG tablet Take 2,000 mcg by mouth daily.     . cholecalciferol (VITAMIN D) 1000 units tablet Take 1,000 Units daily by mouth.     No current facility-administered medications on file prior to visit.   Allergies  Allergen Reactions  . Latex Rash   Social History   Socioeconomic History  . Marital status: Single    Spouse name: Not on file  . Number of children: Not on file  . Years of education: Not on file  . Highest education level: Not on file  Occupational History  . Not on file  Tobacco Use  . Smoking status: Former Smoker    Packs/day: 0.50    Types: Cigarettes  . Smokeless tobacco: Never Used  . Tobacco comment: 3 cigs daily  Vaping Use  . Vaping Use: Never used  Substance and Sexual Activity  . Alcohol use: Yes    Alcohol/week: 0.0 standard drinks    Comment:  SOCIAL  . Drug use: No  . Sexual activity: Yes    Birth control/protection: Surgical    Comment: ablation  Other Topics Concern  . Not on file  Social History Narrative  . Not on file   Social Determinants of Health   Financial Resource Strain: Not on file  Food Insecurity: Not on file  Transportation Needs: Not on file  Physical Activity: Not on file  Stress: Not on file  Social Connections: Not on file  Intimate Partner Violence: Not on file      Review of Systems  Psychiatric/Behavioral: The patient has insomnia.   All other systems reviewed and are negative.      Objective:   Physical Exam Vitals reviewed.  Constitutional:      General: She is not in acute  distress.    Appearance: She is well-developed. She is not diaphoretic.  HENT:     Nose: Nose normal. No congestion or rhinorrhea.     Right Sinus: No maxillary sinus tenderness or frontal sinus tenderness.     Left Sinus: No maxillary sinus tenderness or frontal sinus tenderness.     Mouth/Throat:     Pharynx: No oropharyngeal exudate.  Eyes:     General: No scleral icterus.    Conjunctiva/sclera: Conjunctivae normal.     Pupils: Pupils are equal, round, and reactive to light.  Cardiovascular:     Rate and Rhythm: Normal rate and regular rhythm.     Heart sounds: Normal heart sounds. No murmur heard.   Pulmonary:     Effort: Pulmonary effort is normal. No respiratory distress.     Breath sounds: Normal breath sounds. No wheezing or rales.  Abdominal:     General: Abdomen is flat. Bowel sounds are normal. There is no distension.     Palpations: Abdomen is soft. There is no mass.     Tenderness: There is no abdominal tenderness. There is no guarding or rebound.  Musculoskeletal:     Cervical back: Neck supple.     Right lower leg: No edema.     Left lower leg: No edema.  Lymphadenopathy:     Cervical: No cervical adenopathy.  Neurological:     General: No focal deficit present.     Mental Status: She is oriented to person, place, and time.     Cranial Nerves: No cranial nerve deficit.     Motor: No weakness.     Gait: Gait normal.  Psychiatric:        Mood and Affect: Mood normal.        Behavior: Behavior normal.        Thought Content: Thought content normal.         Assessment & Plan:  Chronic fatigue - Plan: CBC with Differential/Platelet, COMPLETE METABOLIC PANEL WITH GFR, Vitamin B12, TSH, Sedimentation rate  Patient's exam today is completely normal.  Her review of systems is reassuring.  This may be nothing.  However I believe that it would be wise to get a panel of the lab test just to rule out underlying problems.  Therefore we will check a CBC to rule out any  anemias or bone marrow abnormalities.  I will check a CMP to evaluate for any evidence of kidney problems or liver problems or diabetes.  I will check a vitamin B12 level.  I will check a TSH.  I will check a sedimentation rate.  We will await the results of her lab work and then also wait to  see if the patient develops any other symptoms.  She also denies any tick bites or rashes to suggest Lyme disease.

## 2020-12-12 ENCOUNTER — Telehealth: Payer: Self-pay

## 2020-12-12 NOTE — Telephone Encounter (Signed)
Message to sent to [facility] for a1c to be added. Spoke with pt, pt was not fasting during this lab

## 2020-12-14 LAB — CBC WITH DIFFERENTIAL/PLATELET
Absolute Monocytes: 677 cells/uL (ref 200–950)
Basophils Absolute: 31 cells/uL (ref 0–200)
Basophils Relative: 0.5 %
Eosinophils Absolute: 43 cells/uL (ref 15–500)
Eosinophils Relative: 0.7 %
HCT: 43.7 % (ref 35.0–45.0)
Hemoglobin: 14.4 g/dL (ref 11.7–15.5)
Lymphs Abs: 1251 cells/uL (ref 850–3900)
MCH: 32.3 pg (ref 27.0–33.0)
MCHC: 33 g/dL (ref 32.0–36.0)
MCV: 98 fL (ref 80.0–100.0)
MPV: 10 fL (ref 7.5–12.5)
Monocytes Relative: 11.1 %
Neutro Abs: 4099 cells/uL (ref 1500–7800)
Neutrophils Relative %: 67.2 %
Platelets: 232 10*3/uL (ref 140–400)
RBC: 4.46 10*6/uL (ref 3.80–5.10)
RDW: 12.3 % (ref 11.0–15.0)
Total Lymphocyte: 20.5 %
WBC: 6.1 10*3/uL (ref 3.8–10.8)

## 2020-12-14 LAB — COMPLETE METABOLIC PANEL WITH GFR
AG Ratio: 2 (calc) (ref 1.0–2.5)
ALT: 9 U/L (ref 6–29)
AST: 18 U/L (ref 10–35)
Albumin: 4.1 g/dL (ref 3.6–5.1)
Alkaline phosphatase (APISO): 63 U/L (ref 37–153)
BUN/Creatinine Ratio: 20 (calc) (ref 6–22)
BUN: 9 mg/dL (ref 7–25)
CO2: 29 mmol/L (ref 20–32)
Calcium: 9.5 mg/dL (ref 8.6–10.4)
Chloride: 103 mmol/L (ref 98–110)
Creat: 0.46 mg/dL — ABNORMAL LOW (ref 0.50–1.05)
GFR, Est African American: 128 mL/min/{1.73_m2} (ref >=60)
GFR, Est Non African American: 110 mL/min/{1.73_m2} (ref >=60)
Globulin: 2.1 g/dL (calc) (ref 1.9–3.7)
Glucose, Bld: 143 mg/dL — ABNORMAL HIGH (ref 65–99)
Potassium: 4.1 mmol/L (ref 3.5–5.3)
Sodium: 140 mmol/L (ref 135–146)
Total Bilirubin: 0.4 mg/dL (ref 0.2–1.2)
Total Protein: 6.2 g/dL (ref 6.1–8.1)

## 2020-12-14 LAB — TEST AUTHORIZATION

## 2020-12-14 LAB — VITAMIN B12: Vitamin B-12: 343 pg/mL (ref 200–1100)

## 2020-12-14 LAB — HEMOGLOBIN A1C W/OUT EAG: Hgb A1c MFr Bld: 5.1 % of total Hgb (ref <5.7)

## 2020-12-14 LAB — TSH: TSH: 1.58 mIU/L (ref 0.40–4.50)

## 2020-12-14 LAB — SEDIMENTATION RATE: Sed Rate: 6 mm/h (ref 0–30)

## 2021-07-16 ENCOUNTER — Other Ambulatory Visit: Payer: Self-pay | Admitting: Obstetrics & Gynecology

## 2021-07-16 DIAGNOSIS — Z1231 Encounter for screening mammogram for malignant neoplasm of breast: Secondary | ICD-10-CM

## 2021-08-27 ENCOUNTER — Other Ambulatory Visit: Payer: Self-pay | Admitting: Obstetrics & Gynecology

## 2021-08-27 ENCOUNTER — Ambulatory Visit
Admission: RE | Admit: 2021-08-27 | Discharge: 2021-08-27 | Disposition: A | Discharge status: Home / Self Care | Payer: BC Managed Care – PPO | Source: Ambulatory Visit | Attending: Obstetrics & Gynecology | Admitting: Obstetrics & Gynecology

## 2021-08-27 DIAGNOSIS — Z1231 Encounter for screening mammogram for malignant neoplasm of breast: Secondary | ICD-10-CM

## 2022-08-01 ENCOUNTER — Other Ambulatory Visit: Payer: Self-pay | Admitting: Obstetrics & Gynecology

## 2022-08-01 DIAGNOSIS — Z1231 Encounter for screening mammogram for malignant neoplasm of breast: Secondary | ICD-10-CM

## 2022-09-23 ENCOUNTER — Ambulatory Visit
Admission: RE | Admit: 2022-09-23 | Discharge: 2022-09-23 | Disposition: A | Discharge status: Home / Self Care | Payer: BC Managed Care – PPO | Source: Ambulatory Visit | Attending: Obstetrics & Gynecology | Admitting: Obstetrics & Gynecology

## 2022-09-23 DIAGNOSIS — Z1231 Encounter for screening mammogram for malignant neoplasm of breast: Secondary | ICD-10-CM | POA: Diagnosis not present

## 2023-02-17 DIAGNOSIS — C50911 Malignant neoplasm of unspecified site of right female breast: Secondary | ICD-10-CM | POA: Diagnosis not present

## 2024-01-09 DIAGNOSIS — H50112 Monocular exotropia, left eye: Secondary | ICD-10-CM | POA: Diagnosis not present

## 2024-01-09 DIAGNOSIS — H04123 Dry eye syndrome of bilateral lacrimal glands: Secondary | ICD-10-CM | POA: Diagnosis not present

## 2024-04-15 ENCOUNTER — Telehealth: Payer: Self-pay

## 2024-04-15 NOTE — Telephone Encounter (Signed)
 Copied from CRM [id number]. Topic: Appointments - Scheduling Inquiry for Clinic >> Apr 15, 2024 12:57 PM Rosaria E wrote: Reason for CRM: Pt called for an ear cleaning procedure, she says she has this done with a nurse once a year please advise.   Best contact: [phone]

## 2024-04-16 NOTE — Telephone Encounter (Signed)
 Pt. Is scheduled for 4 Pm on Tuesday 04/20/24 for an office visit

## 2024-04-16 NOTE — Telephone Encounter (Unsigned)
 Copied from CRM #[id number]. Topic: Appointments - Scheduling Inquiry for Clinic >> Apr 16, 2024 10:50 AM Tiffini S wrote: Reason for CRM: Patient called to inquire about an ear cleaning procedure, she is asking for a call back at [phone] as she is going out of town and need advice today.  Offered appointment/ wait list with provider in the clinic- was told the appointment was too far out.

## 2024-04-20 ENCOUNTER — Encounter: Payer: Self-pay | Admitting: Family Medicine

## 2024-04-20 ENCOUNTER — Ambulatory Visit: Admitting: Family Medicine

## 2024-04-20 VITALS — BP 136/86 | HR 63 | Temp 98.2°F | Ht 60.0 in | Wt 140.0 lb

## 2024-04-20 DIAGNOSIS — H6993 Unspecified Eustachian tube disorder, bilateral: Secondary | ICD-10-CM | POA: Diagnosis not present

## 2024-04-20 NOTE — Progress Notes (Signed)
 Subjective:    Patient ID: Debbie Woods, female    DOB: 28-Feb-1961, 63 y.o.   MRN: [id number]  Ear Fullness     Patient request that her ear to be checked.  She has bouts of flown in airplane to [employer] .  In the past, she flew when her ears were clogged up and this caused her severe pain.  On exam today, there is no wax in the auditory canal.  Her ear canals are completely clear.  Both tympanic membranes appear pearly gray with no middle ear effusion.  The patient denies any pain or pressure in her inner ear.  She denies any sinus pain or head congestion. Past Medical History:  Diagnosis Date   Breast cancer, right, IDC, Stage II, Triple neg 11/16/2009   Her breast cancer was diagnosed on 11/16/2009. Was invasive ductal carcinoma. She underwent modified radical mastectomy on 12/21/2009. Invasive ductal carcinoma measured 2.8 cm, margins were not involved, and there was metastatic carcinoma in three of 20 lymph nodes. It was triple negative with a Ki-67 of 95%.Subsequent chemo by Dr Twana and radiation by Dr Patrcia    Cancer [facility])    Hemorrhoids    Personal history of chemotherapy 2011   Personal history of radiation therapy 2011   Past Surgical History:  Procedure Laterality Date   BREAST BIOPSY Left 2016   COLONOSCOPY  2010   Dr.Brodie- internal hemorrhoids o/w normal exam.   ENDOMETRIAL ABLATION  2012   HEMORRHOID BANDING  2016   Dr.Rourk   MASTECTOMY Right 2011   Current Outpatient Medications on File Prior to Visit  Medication Sig Dispense Refill   Biotin 1000 MCG tablet Take 2,000 mcg by mouth daily.  (Patient not taking: Reported on 04/20/2024)     cholecalciferol (VITAMIN D) 1000 units tablet Take 1,000 Units daily by mouth. (Patient not taking: Reported on 04/20/2024)     No current facility-administered medications on file prior to visit.   Allergies  Allergen Reactions   Latex Rash   Social History   Socioeconomic History   Marital status: Single    Spouse  name: Not on file   Number of children: Not on file   Years of education: Not on file   Highest education level: Not on file  Occupational History   Not on file  Tobacco Use   Smoking status: Former    Current packs/day: 0.50    Types: Cigarettes   Smokeless tobacco: Never   Tobacco comments:    3 cigs daily  Vaping Use   Vaping status: Never Used  Substance and Sexual Activity   Alcohol use: Yes    Alcohol/week: 0.0 standard drinks of alcohol    Comment: SOCIAL   Drug use: No   Sexual activity: Yes    Birth control/protection: Surgical    Comment: ablation  Other Topics Concern   Not on file  Social History Narrative   Not on file   Social Drivers of Health   Financial Resource Strain: Not on file  Food Insecurity: Not on file  Transportation Needs: Not on file  Physical Activity: Not on file  Stress: Not on file  Social Connections: Not on file  Intimate Partner Violence: Not on file      Review of Systems  All other systems reviewed and are negative.      Objective:   Physical Exam Constitutional:      Appearance: Normal appearance. She is normal weight.  HENT:  Right Ear: Tympanic membrane and ear canal normal. There is no impacted cerumen.     Left Ear: Tympanic membrane and ear canal normal. There is no impacted cerumen.     Nose: Nose normal. No congestion or rhinorrhea.  Cardiovascular:     Heart sounds: Normal heart sounds. No murmur heard.    No gallop.  Pulmonary:     Effort: Pulmonary effort is normal.     Breath sounds: Normal breath sounds. No wheezing or rhonchi.  Neurological:     Mental Status: She is alert.           Assessment & Plan:  Dysfunction of both eustachian tubes I explained to the patient there is no wax in her ears.  She was very reassured by that.  However also explained that her previous problem was likely the inability to equalize pressure due to eustachian tube dysfunction.  I recommended that she try Flonase  or an antihistamine to help maintain patency of the eustachian tube while flying to prevent increased middle ear pressure causing pain.  However her exam today is normal

## 2024-06-08 ENCOUNTER — Other Ambulatory Visit: Payer: Self-pay | Admitting: Obstetrics & Gynecology

## 2024-06-08 DIAGNOSIS — Z1231 Encounter for screening mammogram for malignant neoplasm of breast: Secondary | ICD-10-CM

## 2024-06-10 ENCOUNTER — Encounter: Payer: Self-pay | Admitting: Obstetrics & Gynecology

## 2024-06-10 ENCOUNTER — Other Ambulatory Visit (HOSPITAL_COMMUNITY)
Admission: RE | Admit: 2024-06-10 | Discharge: 2024-06-10 | Disposition: A | Discharge status: Home / Self Care | Source: Ambulatory Visit | Attending: Obstetrics & Gynecology | Admitting: Obstetrics & Gynecology

## 2024-06-10 ENCOUNTER — Ambulatory Visit (INDEPENDENT_AMBULATORY_CARE_PROVIDER_SITE_OTHER): Admitting: Obstetrics & Gynecology

## 2024-06-10 VITALS — BP 149/98 | HR 74 | Wt 136.4 lb

## 2024-06-10 DIAGNOSIS — Z1211 Encounter for screening for malignant neoplasm of colon: Secondary | ICD-10-CM

## 2024-06-10 DIAGNOSIS — Z1151 Encounter for screening for human papillomavirus (HPV): Secondary | ICD-10-CM | POA: Diagnosis not present

## 2024-06-10 DIAGNOSIS — Z01419 Encounter for gynecological examination (general) (routine) without abnormal findings: Secondary | ICD-10-CM | POA: Diagnosis not present

## 2024-06-10 DIAGNOSIS — Z1331 Encounter for screening for depression: Secondary | ICD-10-CM | POA: Diagnosis not present

## 2024-06-10 NOTE — Progress Notes (Signed)
 WELL-WOMAN EXAMINATION Patient name: Debbie Woods MRN [id number]  Date of birth: 06/01/1961 Chief Complaint:   Annual Exam  History of Present Illness:   Debbie Woods is a 63 y.o. G1P1001 PM female being seen today for a routine well-woman exam.    Notes h/o hemorrhoid-and thought this may be contributing some to some clear discharge.  Denies vaginal itching or irritation.  Denies vaginal bleeding.  Denies urinary concerns.  Denies vasomotor symptoms.  Reports no acute GYN concerns   No LMP recorded. Patient has had an ablation.  The current method of family planning is post menopausal status.    Last pap to be collected today.  Last mammogram: 09/2022- scheduled in Nov. Last colonoscopy: had one in 40s     06/10/2024    3:37 PM 07/26/2020    8:12 AM 08/18/2018    2:35 PM 10/15/2016   12:32 PM 06/03/2016    4:07 PM  Depression screen PHQ 2/9  Decreased Interest 0 0 0 0 0  Down, Depressed, Hopeless 0 0 0 0 0  PHQ - 2 Score 0 0 0 0 0  Altered sleeping 0   0   Tired, decreased energy 0   0   Change in appetite 0   0   Feeling bad or failure about yourself  0   0   Trouble concentrating 0   0   Moving slowly or fidgety/restless 0   0   Suicidal thoughts 0   0    PHQ-9 Score 0   0    Difficult doing work/chores    Not difficult at all      Data saved with a previous flowsheet row definition      Review of Systems:   Pertinent items are noted in HPI Denies any headaches, blurred vision, fatigue, shortness of breath, chest pain, abdominal pain, bowel movements, urination, or intercourse unless otherwise stated above.  Pertinent History Reviewed:  Reviewed past medical,surgical, social and family history.  Reviewed problem list, medications and allergies. Physical Assessment:   Vitals:   06/10/24 1532 06/10/24 1537  BP: (!) 165/105 (!) 149/98  Pulse: 74   Weight: 136 lb 6.4 oz (61.9 kg)   Body mass index is 26.64 kg/m.        Physical Examination:   General  appearance - well appearing, and in no distress  Mental status - alert, oriented to person, place, and time  Psych:  She has a normal mood and affect  Skin - warm and dry, normal color, no suspicious lesions noted  Chest - effort normal, all lung fields clear to auscultation bilaterally  Heart - normal rate and regular rhythm  Neck:  midline trachea, no thyromegaly or nodules  Breasts - breasts appear normal, no suspicious masses, no skin or nipple changes or  axillary nodes  Abdomen - soft, nontender, nondistended, no masses or organomegaly  Pelvic - VULVA: normal appearing vulva with no masses, tenderness or lesions  VAGINA: normal appearing vagina with normal color and discharge, no lesions  CERVIX: normal appearing cervix without discharge or lesions, no CMT  Thin prep pap is done with HR HPV cotesting  UTERUS: uterus is felt to be normal size, shape, consistency and nontender   ADNEXA: No adnexal masses or tenderness noted.  Rectal - small hemorrhoid noted  Chaperone: Rutherford Rover     Assessment & Plan:  1) Well-Woman Exam -Pap collected, reviewed ASCCP guidelines - Mammogram scheduled - Colonoscopy referral created  Orders Placed This Encounter  Procedures   Ambulatory referral to Gastroenterology    Meds: No orders of the defined types were placed in this encounter.   Follow-up: Return for 1-67yr annual.   Darlene Bartelt, DO Attending Obstetrician & Gynecologist, [facility], [facility]

## 2024-06-14 ENCOUNTER — Ambulatory Visit: Payer: Self-pay | Admitting: Obstetrics & Gynecology

## 2024-06-14 LAB — CYTOLOGY - PAP
Adequacy: ABSENT
Comment: NEGATIVE
Diagnosis: NEGATIVE
High risk HPV: NEGATIVE

## 2024-06-30 ENCOUNTER — Ambulatory Visit
Admission: RE | Admit: 2024-06-30 | Discharge: 2024-06-30 | Disposition: A | Discharge status: Home / Self Care | Source: Ambulatory Visit | Attending: Obstetrics & Gynecology | Admitting: Obstetrics & Gynecology

## 2024-06-30 ENCOUNTER — Other Ambulatory Visit: Payer: Self-pay | Admitting: Obstetrics & Gynecology

## 2024-06-30 DIAGNOSIS — Z1231 Encounter for screening mammogram for malignant neoplasm of breast: Secondary | ICD-10-CM

## 2024-07-11 ENCOUNTER — Ambulatory Visit (HOSPITAL_COMMUNITY): Payer: Self-pay | Admitting: Obstetrics & Gynecology

## 2024-08-25 ENCOUNTER — Encounter: Payer: Self-pay | Admitting: Internal Medicine

## 2024-10-08 ENCOUNTER — Encounter

## 2024-10-22 ENCOUNTER — Encounter: Admitting: Internal Medicine
